# Patient Record
Sex: Female | Born: 1975 | Race: White | Hispanic: No | Marital: Married | Smoking: Never smoker
Health system: Southern US, Community
[De-identification: ages and names within clinical notes are randomized; demographics above are authoritative.]

## PROBLEM LIST (undated history)

## (undated) DIAGNOSIS — J302 Other seasonal allergic rhinitis: Secondary | ICD-10-CM

## (undated) DIAGNOSIS — M722 Plantar fascial fibromatosis: Secondary | ICD-10-CM

## (undated) HISTORY — PX: PELVIC LAPAROSCOPY: SHX162

## (undated) HISTORY — DX: Plantar fascial fibromatosis: M72.2

## (undated) HISTORY — PX: TUBAL LIGATION: SHX77

## (undated) HISTORY — PX: BASAL CELL CARCINOMA EXCISION: SHX1214

## (undated) HISTORY — DX: Other seasonal allergic rhinitis: J30.2

---

## 2010-05-31 HISTORY — PX: COLONOSCOPY: SHX174

## 2011-05-27 ENCOUNTER — Encounter: Payer: Self-pay | Admitting: Women's Health

## 2011-05-27 ENCOUNTER — Ambulatory Visit (INDEPENDENT_AMBULATORY_CARE_PROVIDER_SITE_OTHER): Payer: BC Managed Care – PPO | Admitting: Women's Health

## 2011-05-27 ENCOUNTER — Other Ambulatory Visit (HOSPITAL_COMMUNITY)
Admission: RE | Admit: 2011-05-27 | Discharge: 2011-05-27 | Disposition: A | Discharge status: Home / Self Care | Payer: BC Managed Care – PPO | Source: Ambulatory Visit | Attending: Obstetrics and Gynecology | Admitting: Obstetrics and Gynecology

## 2011-05-27 VITALS — BP 110/72 | Ht 62.0 in | Wt 177.0 lb

## 2011-05-27 DIAGNOSIS — Z01419 Encounter for gynecological examination (general) (routine) without abnormal findings: Secondary | ICD-10-CM | POA: Insufficient documentation

## 2011-05-27 DIAGNOSIS — B373 Candidiasis of vulva and vagina: Secondary | ICD-10-CM

## 2011-05-27 DIAGNOSIS — N920 Excessive and frequent menstruation with regular cycle: Secondary | ICD-10-CM

## 2011-05-27 DIAGNOSIS — R823 Hemoglobinuria: Secondary | ICD-10-CM

## 2011-05-27 DIAGNOSIS — Z833 Family history of diabetes mellitus: Secondary | ICD-10-CM

## 2011-05-27 MED ORDER — FLUCONAZOLE 150 MG PO TABS: 150.0000 mg | ORAL_TABLET | Freq: Once | ORAL | Status: AC

## 2011-05-27 NOTE — Progress Notes (Signed)
Donna Pitts 12/22/1975 [id number]    History:    The patient presents for annual exam.  6 day heavy monthly cycle with cramps/BTL. Increased PMS type symptoms monthly. Numerous food allergies causing rectal rash/itching.   Past medical history, past surgical history, family history and social history were all reviewed and documented in the EPIC chart. Moved here from [place].   ROS:  A  ROS was performed and pertinent positives and negatives are included in the history.  Exam:  Filed Vitals:   05/27/11 0806  BP: 110/72    General appearance:  Normal Head/Neck:  Normal, without cervical or supraclavicular adenopathy. Thyroid:  Symmetrical, normal in size, without palpable masses or nodularity. Respiratory  Effort:  Normal  Auscultation:  Clear without wheezing or rhonchi Cardiovascular  Auscultation:  Regular rate, without rubs, murmurs or gallops  Edema/varicosities:  Not grossly evident Abdominal  Soft,nontender, without masses, guarding or rebound.  Liver/spleen:  No organomegaly noted  Hernia:  None appreciated  Skin  Inspection:  Grossly normal  Palpation:  Grossly normal Neurologic/psychiatric  Orientation:  Normal with appropriate conversation.  Mood/affect:  Normal  Genitourinary    Breasts: Examined lying and sitting.     Right: Without masses, retractions, discharge or axillary adenopathy.     Left: Without masses, retractions, discharge or axillary adenopathy.   Inguinal/mons:  Normal without inguinal adenopathy  External genitalia:  Normal  BUS/Urethra/Skene's glands:  Normal  Bladder:  Normal  Vagina:  Normal  Cervix:  Normal  Uterus:   normal in size, shape and contour.  Midline and mobile  Adnexa/parametria:     Rt: Without masses or tenderness.   Lt: Without masses or tenderness.  Anus and perineum: Erythematous   Digital rectal exam: Normal sphincter tone without palpated masses or tenderness  Assessment/Plan:  35 y.o. MWF G2 P2 for annual  exam.   Menorrhagia/dysmenorrhea Yeast Numerous food allergies  Plan: CBC, TSH, prolactin, glucose, UA and Pap. Reviewed if labs were normal to proceed with a sonohysterogram due to the changes in her menstrual cycle. Her option reviewed, handout given and reviewed. SBEs, exercise, calcium rich diet, encouraged. Rectal rash will continue treatment her dermatologist and allergist. Diflucan 150 by mouth x1 dose with refill.    Harrington Challenger [facility], 12:17 PM 05/27/2011

## 2011-06-15 ENCOUNTER — Ambulatory Visit (INDEPENDENT_AMBULATORY_CARE_PROVIDER_SITE_OTHER): Payer: BC Managed Care – PPO | Admitting: Gynecology

## 2011-06-15 ENCOUNTER — Ambulatory Visit (INDEPENDENT_AMBULATORY_CARE_PROVIDER_SITE_OTHER): Payer: BC Managed Care – PPO

## 2011-06-15 ENCOUNTER — Encounter: Payer: Self-pay | Admitting: Gynecology

## 2011-06-15 ENCOUNTER — Other Ambulatory Visit: Payer: Self-pay | Admitting: Gynecology

## 2011-06-15 DIAGNOSIS — N831 Corpus luteum cyst of ovary, unspecified side: Secondary | ICD-10-CM

## 2011-06-15 DIAGNOSIS — N920 Excessive and frequent menstruation with regular cycle: Secondary | ICD-10-CM

## 2011-06-15 NOTE — Patient Instructions (Signed)
Follow up for biopsy results. We will discuss treatment options.

## 2011-06-15 NOTE — Progress Notes (Signed)
Patient presents for sonohysterogram the tube menorrhagia. Patient's menses have progressively worsened to where now she has heavy bleeding episodes double protection bleed through episodes. Hormonal studies to include TSH and prolactin were normal and she has a hemoglobin of 12. She is status post BTL has no further desire for childbearing.  Ultrasound shows endometrial echo 8.6 mm. Normal myometrial echo texture.  Right and left ovaries with physiologic changes. Cul-de-sac negative. Sonohysterogram performed, sterile technique, easy catheter introduction, good distention with no abnormalities. Endometrial sample was taken. Patient tolerated well.  Assessment and plan: Menorrhagia, BTL, normal hormone studies and normal sonohysterogram. Patient will follow up for biopsy results. I reviewed options for her menorrhagia to include observation, BCP suppression, Mirena IUD, endometrial ablation, hysterectomy. I gave her literature on HerOption endometrial ablation. She's interested in considering this and will follow up with her decision.

## 2011-06-16 ENCOUNTER — Other Ambulatory Visit: Payer: Self-pay

## 2011-06-16 ENCOUNTER — Telehealth: Payer: Self-pay

## 2011-06-16 DIAGNOSIS — N920 Excessive and frequent menstruation with regular cycle: Secondary | ICD-10-CM

## 2011-06-16 MED ORDER — PROGESTERONE MICRONIZED 200 MG PO CAPS: ORAL_CAPSULE | ORAL | Status: DC

## 2011-06-16 MED ORDER — MISOPROSTOL 200 MCG PO TABS: ORAL_TABLET | ORAL | Status: DC

## 2011-06-16 NOTE — Telephone Encounter (Signed)
Left message for patient regarding Dr. Velvet Bathe reply. I did tell her that no time available on schedules until Friday at 9am.  She would need to postpone her trip until Sunday or Monday and would need a preop consult before procedure.  Left message for her to call me and let me know what she thinks she can do.

## 2011-06-16 NOTE — Telephone Encounter (Signed)
I called patient to let her know I checked her ins benefits for Her Option Ablation in the office.  Her deductible and co-insurance apply. She has met her $4000 deductible this year and insurance is paying 100%.  However, her benefit plan year ends 07/01/11 and after that it will cost her $4000 to have procedure.    Patient would like to schedule ASAP. I explained it is scheduled with her menstrual cycle. She said that the [facility] yesterday appears to have brought her period on.  Her LMP before this bleeding was 05/19/11.  She expects if this is a normal period that she will be finished by Monday.  06/21/11.  The next issue is she has a business trip to [place] and was to leave next Friday 06/25/11.  However, she could leave Sat, Sun. Or Mon 1/28.  I told her I was not sure Dr. Velvet Bathe would want to do this procedure and have her fly to [place] and be so far away so soon after procedure.  I told her I would check with Dr. Velvet Bathe for his recommendation.    (I do not have time available on the u/s schedule until Friday 06/25/11.  I would have to reschedule several patients on another day to make it work on an earlier day next week. That can be done if Dr. Velvet Bathe requests [place] to.)  Dr. Orlene Plum.

## 2011-06-16 NOTE — Telephone Encounter (Signed)
If we can arrange this it is okay by me. I do not have a problem if she flys 2 days later. I would not fly out the very next day just in case she has more cramping which can sometimes happen.

## 2011-06-16 NOTE — Telephone Encounter (Signed)
Patient called me back.  She postponed her trip until Monday, Jan 28.  We scheduled her ablation procedure for Friday, Jan 25 at [facility] and she will come for preop consult with Dr. Velvet Bathe the morning before at 9:30am.  I instructed her regarding starting the Prometrium tomorrow night (Day 3) hs and taking q hs until procedure.  I escribed this to her pharmacy.  She was also advised regarding need for Cytotec tab vaginally hs before surgery and that was escribed as well. I made her aware that she would need someone to drive her to and from the office the day of surgery and her mom will do that.  She was instructed regarding a full bladder for the procedure.  I have mailed her a sheet with full bladder instructions and all her dates and times are included.

## 2011-06-24 ENCOUNTER — Ambulatory Visit (INDEPENDENT_AMBULATORY_CARE_PROVIDER_SITE_OTHER): Payer: BC Managed Care – PPO | Admitting: Gynecology

## 2011-06-24 ENCOUNTER — Other Ambulatory Visit: Payer: Self-pay

## 2011-06-24 ENCOUNTER — Encounter: Payer: Self-pay | Admitting: Gynecology

## 2011-06-24 DIAGNOSIS — R21 Rash and other nonspecific skin eruption: Secondary | ICD-10-CM

## 2011-06-24 DIAGNOSIS — N920 Excessive and frequent menstruation with regular cycle: Secondary | ICD-10-CM

## 2011-06-24 MED ORDER — DIAZEPAM 5 MG PO TABS: 5.0000 mg | ORAL_TABLET | Freq: Four times a day (QID) | ORAL | Status: AC | PRN

## 2011-06-24 MED ORDER — AZITHROMYCIN 500 MG PO TABS: 500.0000 mg | ORAL_TABLET | Freq: Every day | ORAL | Status: AC

## 2011-06-24 MED ORDER — FLUCONAZOLE 200 MG PO TABS: 200.0000 mg | ORAL_TABLET | Freq: Every day | ORAL | Status: AC

## 2011-06-24 MED ORDER — NYSTATIN-TRIAMCINOLONE 100000-0.1 UNIT/GM-% EX OINT: TOPICAL_OINTMENT | Freq: Two times a day (BID) | CUTANEOUS | Status: AC

## 2011-06-24 MED ORDER — HYDROCODONE-ACETAMINOPHEN 5-500 MG PO TABS: 2.0000 | ORAL_TABLET | Freq: Four times a day (QID) | ORAL | Status: AC | PRN

## 2011-06-24 NOTE — Progress Notes (Signed)
Patient presents preop for upcoming HerOption endometrial ablation. She's also complaining of a perianal rash that's been chronic recurrent.  Exam with Amy chaperone present Pelvic external with intense perianal rash with skin cracking. BUS vagina normal. Cervix normal. Uterus normal size midline mobile nontender. Adnexa without masses.  Assessment and plan: 1. Perianal rash consistent with yeast. Treat with Diflucan 200 daily x7 days. Mytrex cream twice a day. Follow up if persists or recurs. 2. I reviewed HerOption endometrial ablation. She is having unacceptable menorrhagia, status post BTL with negative sonohysterogram and negative endometrial biopsy. We discussed options for treatment previously and she elected for endometrial ablation. She understands is a permanent procedure and that she should never pursue pregnancy despite having a tubal sterilization after the procedure. She also understands or is no guarantees that this will a limited her menorrhagia. She may bleed as heavy or heavier following the procedure she understands and accepts this. She also understands that she will more likely continue to have menses following the procedure but hopefully lighter. The procedure itself was reviewed with her and the risks to include infection, hemorrhage necessitating transfusion, damage to internal organs either through perforation or transuterine cryo- damage to bladder and intestines requiring major exploratory reparative surgeries. The risks of hematometria are also discussed. I reviewed her medications and how to use them and the postoperative precautions. She has read through the consent form and signed the consent her questions were answered.

## 2011-06-24 NOTE — Patient Instructions (Signed)
Follow up for HerOption endometrial ablation tomorrow Take Diflucan one pill daily for 7 days and apply cream perianally twice daily

## 2011-06-25 ENCOUNTER — Ambulatory Visit (INDEPENDENT_AMBULATORY_CARE_PROVIDER_SITE_OTHER): Payer: BC Managed Care – PPO | Admitting: Gynecology

## 2011-06-25 ENCOUNTER — Ambulatory Visit: Payer: BC Managed Care – PPO

## 2011-06-25 ENCOUNTER — Encounter: Payer: Self-pay | Admitting: Gynecology

## 2011-06-25 VITALS — BP 124/80 | HR 88

## 2011-06-25 VITALS — BP 122/78 | HR 74

## 2011-06-25 DIAGNOSIS — N949 Unspecified condition associated with female genital organs and menstrual cycle: Secondary | ICD-10-CM

## 2011-06-25 DIAGNOSIS — N920 Excessive and frequent menstruation with regular cycle: Secondary | ICD-10-CM

## 2011-06-25 HISTORY — PX: ENDOMETRIAL CRYOABLATION: SHX1499

## 2011-06-25 MED ORDER — KETOROLAC TROMETHAMINE 30 MG/ML IJ SOLN: 60.0000 mg | Freq: Once | INTRAMUSCULAR | Status: AC

## 2011-06-25 MED ORDER — IBUPROFEN 800 MG PO TABS: 800.0000 mg | ORAL_TABLET | Freq: Three times a day (TID) | ORAL | Status: AC | PRN

## 2011-06-25 MED ORDER — LIDOCAINE HCL (PF) 1 % IJ SOLN: 12.0000 mL | Freq: Once | INTRAMUSCULAR | Status: DC

## 2011-06-25 NOTE — Patient Instructions (Signed)
Follow postoperative instruction sheet as provided. Follow up appointment February 8 at 9:30 AM

## 2011-06-25 NOTE — Progress Notes (Signed)
HER OPTION ENDOMETRIAL ABLATION PROCEDURAL NOTE    Donna Pitts 19-Apr-1976 [id number]   06/25/2011  Diagnosis:  Excessive uterine bleeding / Menorrhagia  Procedure:  Endometrial cryoablation with intraoperative ultrasonic guidance  Procedure:  The patient was brought to the treatment room having previously been counseled for the procedure and having signed the consent form that is scanned into Epic. Pre procedural medications received were Toradol 30 mg IM, Valium 5 mg by mouth.  The patient was placed in the dorsal lithotomy position and a speculum was inserted. The cervix and upper vagina were cleansed with Betadine. A single-tooth tenaculum was placed on the anterior lip of the cervix. A  paracervical block was placed yes using 8 cc's of 1% lidocaine.  The uterus was yes sounded 9 centimeters. Cervical dilatation performed no . Under ultrasound guidance, the Her Option probe was introduced into the uterine cavity after the preprocedural sequence was performed. After assuring proper cornual placement, cryoablation was then performed under continuous ultrasound guidance monitoring the growth of the cryo-zone. Sequential cryoablation's were performed in the following order:   Location   Length of Time Myometrial Depth 1. right cornua   6 minutes  14 mm 2. left cornua   5 minutes  12 mm   The patient did receive 1 Vicodin 5-500 between the 2 freeze cycles  .Upon completion of the procedure the instruments were removed, hemostasis visualized the patient was assisted to the bathroom and then to another exam room where she was observed. The patient tolerated the procedure well and was released in stable condition with her driver along with a copy of the postprocedural instructions and precautions which were reviewed with her. She is to return to the office in 2 weeks for post procedural check and received an appointment July 09 2011 at 9:30 AM.    Dara Lords MD, 10:21 AM  06/25/2011

## 2011-07-09 ENCOUNTER — Ambulatory Visit (INDEPENDENT_AMBULATORY_CARE_PROVIDER_SITE_OTHER): Payer: BC Managed Care – PPO | Admitting: Gynecology

## 2011-07-09 ENCOUNTER — Encounter: Payer: Self-pay | Admitting: Gynecology

## 2011-07-09 VITALS — BP 112/66

## 2011-07-09 DIAGNOSIS — L292 Pruritus vulvae: Secondary | ICD-10-CM

## 2011-07-09 DIAGNOSIS — L293 Anogenital pruritus, unspecified: Secondary | ICD-10-CM

## 2011-07-09 DIAGNOSIS — Z9889 Other specified postprocedural states: Secondary | ICD-10-CM

## 2011-07-09 MED ORDER — FLUCONAZOLE 200 MG PO TABS: 200.0000 mg | ORAL_TABLET | Freq: Every day | ORAL | Status: AC

## 2011-07-09 NOTE — Progress Notes (Signed)
Patient presents 2 weeks postoperative from HerOption endometrial ablation. She is doing well having a watery discharge. She notes some perineal irritation. She was treated for a yeast vulvovaginitis with Diflucan 200 mg x7 days which work but now seems to be returning.  Exam with Selena Batten chaperone present Pelvic external BUS vagina normal with slight watery discharge. Cervix normal. Uterus normal size midline mobile nontender. Adnexa without masses or tenderness.  Assessment and plan: Status post HerOption endometrial ablation doing well.  I refilled her Diflucan 200 mg #10 to be taken several daily now to eradicate early symptoms and have the remainder available if she has recurrences.  Otherwise she will keep a menstrual calendar and assuming she has a good response with light menses will see [place] at the end of this coming year for her annual exam.

## 2011-07-09 NOTE — Patient Instructions (Signed)
Used Diflucan as needed. Keep menstrual calendar as long as acceptable then follow up end of this year for your annual exam.

## 2011-09-08 MED ORDER — KETOROLAC TROMETHAMINE 30 MG/ML IJ SOLN: 60.0000 mg | Freq: Once | INTRAMUSCULAR | Status: DC

## 2013-02-23 ENCOUNTER — Encounter: Payer: Self-pay | Admitting: Internal Medicine

## 2013-02-23 ENCOUNTER — Ambulatory Visit (INDEPENDENT_AMBULATORY_CARE_PROVIDER_SITE_OTHER): Payer: Managed Care, Other (non HMO) | Admitting: Internal Medicine

## 2013-02-23 VITALS — BP 130/70 | HR 76 | Temp 98.1°F | Ht 62.25 in | Wt 180.0 lb

## 2013-02-23 DIAGNOSIS — Z803 Family history of malignant neoplasm of breast: Secondary | ICD-10-CM | POA: Insufficient documentation

## 2013-02-23 DIAGNOSIS — Z23 Encounter for immunization: Secondary | ICD-10-CM

## 2013-02-23 DIAGNOSIS — Z0000 Encounter for general adult medical examination without abnormal findings: Secondary | ICD-10-CM

## 2013-02-23 NOTE — Assessment & Plan Note (Signed)
General medical exam normal today. Breast and pelvic exam deferred as completed by her OB/GYN. Encouraged healthy diet and regular exercise. Tdap and Flu vaccine given today. Will check labs including CBC, CMP, lipids. Will set up baseline mammogram given family h/o breast cancer. Follow up 1 year and prn.

## 2013-02-23 NOTE — Progress Notes (Signed)
Subjective:    Patient ID: Donna Pitts, female    DOB: 02/02/1976, 37 y.o.   MRN: [id number]  HPI 37YO female presents to establish care. Generally has been healthy. Had some issues with menorrhagia and ultimately underwent endometrial ablation. Now has very light menses lasting 2-3 days each month. S/p Tubal ligation.   Also recently had issues with plantar fasciitis. Symptoms improved with meloxicam, but unable to tolerate because of dizziness and stomach upset. Followed by podiatry.  Tries to follow a healthy diet and get regular exercise. Somewhat limited because of frequent travel with work.   Outpatient Encounter Prescriptions as of 02/23/2013  Medication Sig Dispense Refill  . cetirizine (ZYRTEC) 10 MG tablet Take 10 mg by mouth daily.         No facility-administered encounter medications on file as of 02/23/2013.   BP 130/70  Pulse 76  Temp(Src) 98.1 F (36.7 C) (Oral)  Ht 5' 2.25" (1.581 m)  Wt 180 lb (81.647 kg)  BMI 32.66 kg/m2  SpO2 97%  LMP 02/06/2013  Review of Systems  Constitutional: Negative for fever, chills, appetite change, fatigue and unexpected weight change.  HENT: Negative for ear pain, congestion, sore throat, trouble swallowing, neck pain, voice change and sinus pressure.   Eyes: Negative for visual disturbance.  Respiratory: Negative for cough, shortness of breath, wheezing and stridor.   Cardiovascular: Negative for chest pain, palpitations and leg swelling.  Gastrointestinal: Negative for nausea, vomiting, abdominal pain, diarrhea, constipation, blood in stool, abdominal distention and anal bleeding.  Genitourinary: Negative for dysuria and flank pain.  Musculoskeletal: Negative for myalgias, arthralgias and gait problem.  Skin: Negative for color change and rash.  Neurological: Negative for dizziness and headaches.  Hematological: Negative for adenopathy. Does not bruise/bleed easily.  Psychiatric/Behavioral: Negative for suicidal ideas, sleep  disturbance and dysphoric mood. The patient is not nervous/anxious.        Objective:   Physical Exam  Constitutional: She is oriented to person, place, and time. She appears well-developed and well-nourished. No distress.  HENT:  Head: Normocephalic and atraumatic.  Right Ear: External ear normal.  Left Ear: External ear normal.  Nose: Nose normal.  Mouth/Throat: Oropharynx is clear and moist. No oropharyngeal exudate.  Eyes: Conjunctivae are normal. Pupils are equal, round, and reactive to light. Right eye exhibits no discharge. Left eye exhibits no discharge. No scleral icterus.  Neck: Normal range of motion. Neck supple. No tracheal deviation present. No thyromegaly present.  Cardiovascular: Normal rate, regular rhythm, normal heart sounds and intact distal pulses.  Exam reveals no gallop and no friction rub.   No murmur heard. Pulmonary/Chest: Effort normal and breath sounds normal. No accessory muscle usage. Not tachypneic. No respiratory distress. She has no decreased breath sounds. She has no wheezes. She has no rhonchi. She has no rales. She exhibits no tenderness.  Abdominal: Soft. Bowel sounds are normal. She exhibits no distension and no mass. There is no tenderness. There is no rebound and no guarding.  Musculoskeletal: Normal range of motion. She exhibits no edema and no tenderness.  Lymphadenopathy:    She has no cervical adenopathy.  Neurological: She is alert and oriented to person, place, and time. No cranial nerve deficit. She exhibits normal muscle tone. Coordination normal.  Skin: Skin is warm and dry. No rash noted. She is not diaphoretic. No erythema. No pallor.  Psychiatric: She has a normal mood and affect. Her behavior is normal. Judgment and thought content normal.  Assessment & Plan:

## 2013-03-01 ENCOUNTER — Other Ambulatory Visit (INDEPENDENT_AMBULATORY_CARE_PROVIDER_SITE_OTHER): Payer: Managed Care, Other (non HMO)

## 2013-03-01 DIAGNOSIS — Z0000 Encounter for general adult medical examination without abnormal findings: Secondary | ICD-10-CM

## 2013-03-01 LAB — CBC WITH DIFFERENTIAL/PLATELET
Basophils Relative: 0.7 % (ref 0.0–3.0)
Eosinophils Absolute: 0.3 10*3/uL (ref 0.0–0.7)
Eosinophils Relative: 5.1 % — ABNORMAL HIGH (ref 0.0–5.0)
Lymphocytes Relative: 33 % (ref 12.0–46.0)
MCHC: 34.2 g/dL (ref 30.0–36.0)
Monocytes Relative: 5.6 % (ref 3.0–12.0)
Neutro Abs: 3.3 10*3/uL (ref 1.4–7.7)
Neutrophils Relative %: 55.6 % (ref 43.0–77.0)
Platelets: 293 10*3/uL (ref 150.0–400.0)
RBC: 3.93 Mil/uL (ref 3.87–5.11)
WBC: 5.9 10*3/uL (ref 4.5–10.5)

## 2013-03-02 LAB — COMPREHENSIVE METABOLIC PANEL
Albumin: 4.1 g/dL (ref 3.5–5.2)
BUN: 8 mg/dL (ref 6–23)
CO2: 25 mEq/L (ref 19–32)
Calcium: 8.6 mg/dL (ref 8.4–10.5)
Chloride: 107 mEq/L (ref 96–112)
GFR: 106.38 mL/min (ref >60.00)
Glucose, Bld: 75 mg/dL (ref 70–99)
Potassium: 4.2 mEq/L (ref 3.5–5.1)
Total Bilirubin: 0.8 mg/dL (ref 0.3–1.2)

## 2013-03-02 LAB — LIPID PANEL
Cholesterol: 190 mg/dL (ref 0–200)
HDL: 45.1 mg/dL (ref >39.00)
LDL Cholesterol: 130 mg/dL — ABNORMAL HIGH (ref 0–99)
Total CHOL/HDL Ratio: 4
Triglycerides: 73 mg/dL (ref 0.0–149.0)
VLDL: 14.6 mg/dL (ref 0.0–40.0)

## 2013-03-05 ENCOUNTER — Encounter: Payer: Self-pay | Admitting: *Deleted

## 2013-05-23 ENCOUNTER — Ambulatory Visit (INDEPENDENT_AMBULATORY_CARE_PROVIDER_SITE_OTHER): Payer: Managed Care, Other (non HMO) | Admitting: Internal Medicine

## 2013-05-23 ENCOUNTER — Encounter: Payer: Self-pay | Admitting: Internal Medicine

## 2013-05-23 VITALS — BP 120/80 | HR 104 | Temp 100.0°F

## 2013-05-23 DIAGNOSIS — R112 Nausea with vomiting, unspecified: Secondary | ICD-10-CM

## 2013-05-23 DIAGNOSIS — R6889 Other general symptoms and signs: Secondary | ICD-10-CM

## 2013-05-23 LAB — POCT INFLUENZA A/B
Influenza A, POC: NEGATIVE
Influenza B, POC: NEGATIVE

## 2013-05-23 MED ORDER — ONDANSETRON HCL 8 MG PO TABS: 8.0000 mg | ORAL_TABLET | Freq: Three times a day (TID) | ORAL | Status: DC | PRN

## 2013-05-23 MED ORDER — OSELTAMIVIR PHOSPHATE 75 MG PO CAPS: 75.0000 mg | ORAL_CAPSULE | Freq: Two times a day (BID) | ORAL | Status: DC

## 2013-05-23 MED ORDER — HYDROCOD POLST-CHLORPHEN POLST 10-8 MG/5ML PO LQCR: 5.0000 mL | Freq: Two times a day (BID) | ORAL | Status: DC | PRN

## 2013-05-23 MED ORDER — PROMETHAZINE HCL 25 MG/ML IJ SOLN
25.0000 mg | Freq: Once | INTRAMUSCULAR | Status: AC
  Administered 2013-05-23: 25 mg via INTRAVENOUS

## 2013-05-23 MED ORDER — IBUPROFEN 800 MG PO TABS: 800.0000 mg | ORAL_TABLET | Freq: Three times a day (TID) | ORAL | Status: DC | PRN

## 2013-05-23 NOTE — Progress Notes (Signed)
   Subjective:    Patient ID: Donna Pitts, female    DOB: [birth year], 37 y.o.   MRN: [id number]  HPI 37YO female presents for acute visit. Started fever, chills, dry cough, headache, malaise, myalgias, fatigue last night. No dyspnea or chest pain.  Nauseous, no vomiting. Watery diarrhea last night. Daughter pos for Flu A. Had flu shot this year.    Outpatient Encounter Prescriptions as of 05/23/2013  Medication Sig  . cetirizine (ZYRTEC) 10 MG tablet Take 10 mg by mouth daily.        Review of Systems  Constitutional: Positive for fever, chills and fatigue. Negative for unexpected weight change.  HENT: Positive for congestion, postnasal drip, rhinorrhea, sinus pressure and sore throat. Negative for ear discharge, ear pain, facial swelling, hearing loss, mouth sores, nosebleeds, sneezing, tinnitus, trouble swallowing and voice change.   Eyes: Negative for pain, discharge, redness and visual disturbance.  Respiratory: Positive for cough and shortness of breath. Negative for chest tightness, wheezing and stridor.   Cardiovascular: Negative for chest pain, palpitations and leg swelling.  Gastrointestinal: Positive for nausea and diarrhea. Negative for vomiting and abdominal pain.  Musculoskeletal: Positive for myalgias. Negative for arthralgias, neck pain and neck stiffness.  Skin: Negative for color change and rash.  Neurological: Negative for dizziness, weakness, light-headedness and headaches.  Hematological: Negative for adenopathy.       Objective:   Physical Exam  Constitutional: She is oriented to person, place, and time. She appears well-developed and well-nourished. She appears ill. No distress.  HENT:  Head: Normocephalic and atraumatic.  Right Ear: External ear normal.  Left Ear: External ear normal.  Nose: Mucosal edema and rhinorrhea present.  Mouth/Throat: Posterior oropharyngeal erythema present. No oropharyngeal exudate.  Eyes: Conjunctivae are normal. Pupils  are equal, round, and reactive to light. Right eye exhibits no discharge. Left eye exhibits no discharge. No scleral icterus.  Neck: Normal range of motion. Neck supple. No tracheal deviation present. No thyromegaly present.  Cardiovascular: Normal rate, regular rhythm, normal heart sounds and intact distal pulses.  Exam reveals no gallop and no friction rub.   No murmur heard. Pulmonary/Chest: Effort normal. No accessory muscle usage. Not tachypneic. No respiratory distress. She has no decreased breath sounds. She has no wheezes. She has rhonchi (scattered). She has no rales. She exhibits no tenderness.  Musculoskeletal: Normal range of motion. She exhibits no edema and no tenderness.  Lymphadenopathy:    She has no cervical adenopathy.  Neurological: She is alert and oriented to person, place, and time. No cranial nerve deficit. She exhibits normal muscle tone. Coordination normal.  Skin: Skin is warm and dry. No rash noted. She is not diaphoretic. No erythema. No pallor.  Psychiatric: She has a normal mood and affect. Her behavior is normal. Judgment and thought content normal.          Assessment & Plan:

## 2013-05-23 NOTE — Assessment & Plan Note (Signed)
Symptoms and exam most consistent with influenza. Pt daughter pos for Flu A. Rapid test here in office negative, however will treat as flu. Tamiflu 75mg  bid x 5 days. Tussionex as needed for cough. Ibuprofen prn pain or fever. Ondansetron at home as needed for nausea. Phenergan given IM today. Encouraged rest, adequate fluid intake. Follow up if symptoms not improving over next 24-48hr.

## 2013-05-23 NOTE — Patient Instructions (Signed)

## 2013-10-16 ENCOUNTER — Other Ambulatory Visit: Payer: Self-pay | Admitting: Internal Medicine

## 2013-10-16 MED ORDER — ONDANSETRON 8 MG PO TBDP: 8.0000 mg | ORAL_TABLET | Freq: Three times a day (TID) | ORAL | Status: DC | PRN

## 2013-10-16 MED ORDER — CIPROFLOXACIN HCL 500 MG PO TABS: 500.0000 mg | ORAL_TABLET | Freq: Two times a day (BID) | ORAL | Status: DC

## 2013-10-16 MED ORDER — SCOPOLAMINE 1 MG/3DAYS TD PT72
1.0000 | MEDICATED_PATCH | TRANSDERMAL | Status: DC
Start: 2013-10-16 — End: 2015-05-20

## 2014-03-16 ENCOUNTER — Ambulatory Visit (INDEPENDENT_AMBULATORY_CARE_PROVIDER_SITE_OTHER): Payer: BC Managed Care – PPO

## 2014-03-16 DIAGNOSIS — Z23 Encounter for immunization: Secondary | ICD-10-CM

## 2014-04-01 ENCOUNTER — Encounter: Payer: Self-pay | Admitting: Internal Medicine

## 2015-05-20 ENCOUNTER — Encounter: Payer: Self-pay | Admitting: Internal Medicine

## 2015-05-20 ENCOUNTER — Ambulatory Visit (INDEPENDENT_AMBULATORY_CARE_PROVIDER_SITE_OTHER): Payer: BLUE CROSS/BLUE SHIELD | Admitting: Internal Medicine

## 2015-05-20 VITALS — BP 124/86 | HR 107 | Temp 100.5°F | Wt 191.2 lb

## 2015-05-20 DIAGNOSIS — J111 Influenza due to unidentified influenza virus with other respiratory manifestations: Secondary | ICD-10-CM | POA: Insufficient documentation

## 2015-05-20 LAB — POCT INFLUENZA A/B
INFLUENZA A, POC: POSITIVE — AB
INFLUENZA B, POC: POSITIVE — AB

## 2015-05-20 MED ORDER — ONDANSETRON HCL 8 MG PO TABS: 8.0000 mg | ORAL_TABLET | Freq: Three times a day (TID) | ORAL | Status: DC | PRN

## 2015-05-20 MED ORDER — OSELTAMIVIR PHOSPHATE 75 MG PO CAPS: 75.0000 mg | ORAL_CAPSULE | Freq: Two times a day (BID) | ORAL | Status: DC

## 2015-05-20 MED ORDER — HYDROCOD POLST-CPM POLST ER 10-8 MG/5ML PO SUER: 5.0000 mL | Freq: Two times a day (BID) | ORAL | Status: DC | PRN

## 2015-05-20 NOTE — Addendum Note (Signed)
Addended by: Ronna PolioWALKER, Eugenie Harewood A on: 05/20/2015 02:37 PM   Modules accepted: Orders

## 2015-05-20 NOTE — Progress Notes (Signed)
Subjective:    Patient ID: Donna GhentChristina Pitts, female    DOB: Apr 22, 1976, 39 y.o.   MRN: [id number]  HPI  39YO female presents for acute visit.  Last two nights has had chills and fever. This morning feels achy with congestion and cough. Cough is non-productive. No dyspnea. Not taking anything for this.  Had Influenza vaccine in Oct 2016.  Wt Readings from Last 3 Encounters:  05/20/15 191 lb 4 oz (86.75 kg)  02/23/13 180 lb (81.647 kg)  05/27/11 177 lb (80.287 kg)   BP Readings from Last 3 Encounters:  05/20/15 124/86  05/23/13 120/80  02/23/13 130/70    Past Medical History  Diagnosis Date  . Dysmenorrhea   . Seasonal allergies   . Plantar fasciitis     Dr. Orland Jarredroxler   Family History  Problem Relation Age of Onset  . Hypertension Mother   . Thyroid disease Mother   . Diabetes Father   . Hypertension Father   . Diabetes Paternal Grandmother   . Hypertension Paternal Grandmother   . Breast cancer Paternal Grandmother   . Cancer Paternal Grandmother [id number]    breast  . Diabetes Paternal Grandfather   . Hypertension Paternal Grandfather   . Heart disease Paternal Grandfather [id number]  . Allergies Son   . Heart disease Maternal Grandmother    Past Surgical History  Procedure Laterality Date  . Colonoscopy  2012  . Pelvic laparoscopy    . Tubal ligation    . Endometrial cryoablation  06/25/2011    HEROPTION  . Cesarean section      X2, pre-ecclampsia, HEELP   Social History   Social History  . Marital Status: Married    Spouse Name: N/A  . Number of Children: N/A  . Years of Education: N/A   Social History Main Topics  . Smoking status: Never Smoker   . Smokeless tobacco: Never Used  . Alcohol Use: Yes     Comment: OCC  . Drug Use: No  . Sexual Activity: Yes    Birth Control/ Protection: Surgical     Comment: TUBAL LIGATION   Other Topics Concern  . None   Social History Narrative   [place] in [place]      Work - [employer]      Diet -  regular      Exercise - limited by work and travel    Review of Systems  Constitutional: Positive for fever, chills and fatigue. Negative for unexpected weight change.  HENT: Positive for congestion, ear pain, postnasal drip and rhinorrhea. Negative for ear discharge, facial swelling, hearing loss, mouth sores, nosebleeds, sinus pressure, sneezing, sore throat, tinnitus, trouble swallowing and voice change.   Eyes: Negative for pain, discharge, redness and visual disturbance.  Respiratory: Positive for cough. Negative for chest tightness, shortness of breath, wheezing and stridor.   Cardiovascular: Negative for chest pain, palpitations and leg swelling.  Musculoskeletal: Positive for myalgias and arthralgias. Negative for neck pain and neck stiffness.  Skin: Negative for color change and rash.  Neurological: Negative for dizziness, weakness, light-headedness and headaches.  Hematological: Negative for adenopathy.       Objective:    BP 124/86 mmHg  Pulse 107  Temp(Src) 100.5 F (38.1 C) (Oral)  Wt 191 lb 4 oz (86.75 kg)  SpO2 98%  LMP 05/16/2015 (Approximate) Physical Exam  Constitutional: She is oriented to person, place, and time. She appears well-developed and well-nourished. No distress.  HENT:  Head: Normocephalic and atraumatic.  Right  Ear: External ear normal.  Left Ear: External ear normal.  Nose: Nose normal.  Mouth/Throat: Oropharynx is clear and moist. No oropharyngeal exudate.  Eyes: Conjunctivae are normal. Pupils are equal, round, and reactive to light. Right eye exhibits no discharge. Left eye exhibits no discharge. No scleral icterus.  Neck: Normal range of motion. Neck supple. No tracheal deviation present. No thyromegaly present.  Cardiovascular: Regular rhythm, normal heart sounds and intact distal pulses.  Tachycardia present.  Exam reveals no gallop and no friction rub.   No murmur heard. Pulmonary/Chest: Effort normal and breath sounds normal. No accessory  muscle usage. No tachypnea. No respiratory distress. She has no decreased breath sounds. She has no wheezes. She has no rhonchi. She has no rales. She exhibits no tenderness.  Musculoskeletal: Normal range of motion. She exhibits no edema or tenderness.  Lymphadenopathy:    She has no cervical adenopathy.  Neurological: She is alert and oriented to person, place, and time. No cranial nerve deficit. She exhibits normal muscle tone. Coordination normal.  Skin: Skin is warm and dry. No rash noted. She is not diaphoretic. No erythema. No pallor.  Psychiatric: She has a normal mood and affect. Her behavior is normal. Judgment and thought content normal.          Assessment & Plan:   Problem List Items Addressed This Visit      Unprioritized   Influenza with respiratory manifestation - Primary    Symptoms c/w Influenza. Rapid test was positive. Will start Tamiflu. Encouraged rest, adequate fluids. Start Sudafed to help with congestion. Tussionex as needed for cough. Call if symptoms are not improving this week.      Relevant Medications   oseltamivir (TAMIFLU) 75 MG capsule   Other Relevant Orders   POCT Influenza A/B (Completed)       Return if symptoms worsen or fail to improve.

## 2015-05-20 NOTE — Patient Instructions (Addendum)
Start Tamiflu twice daily to help shorten course of influenza.  Start Claritin D 12 hr.  Use Tussionex as needed for cough.  Follow up if symptoms are not improving.  Influenza, Adult Influenza ("the flu") is a viral infection of the respiratory tract. It occurs more often in winter months because people spend more time in close contact with one another. Influenza can make you feel very sick. Influenza easily spreads from person to person (contagious). CAUSES  Influenza is caused by a virus that infects the respiratory tract. You can catch the virus by breathing in droplets from an infected person's cough or sneeze. You can also catch the virus by touching something that was recently contaminated with the virus and then touching your mouth, nose, or eyes. RISKS AND COMPLICATIONS You may be at risk for a more severe case of influenza if you smoke cigarettes, have diabetes, have chronic heart disease (such as heart failure) or lung disease (such as asthma), or if you have a weakened immune system. Elderly people and pregnant women are also at risk for more serious infections. The most common problem of influenza is a lung infection (pneumonia). Sometimes, this problem can require emergency medical care and may be life threatening. SIGNS AND SYMPTOMS  Symptoms typically last 4 to 10 days and may include:  Fever.  Chills.  Headache, body aches, and muscle aches.  Sore throat.  Chest discomfort and cough.  Poor appetite.  Weakness or feeling tired.  Dizziness.  Nausea or vomiting. DIAGNOSIS  Diagnosis of influenza is often made based on your history and a physical exam. A nose or throat swab test can be done to confirm the diagnosis. TREATMENT  In mild cases, influenza goes away on its own. Treatment is directed at relieving symptoms. For more severe cases, your health care provider may prescribe antiviral medicines to shorten the sickness. Antibiotic medicines are not effective  because the infection is caused by a virus, not by bacteria. HOME CARE INSTRUCTIONS  Take medicines only as directed by your health care provider.  Use a cool mist humidifier to make breathing easier.  Get plenty of rest until your temperature returns to normal. This usually takes 3 to 4 days.  Drink enough fluid to keep your urine clear or pale yellow.  Cover yourmouth and nosewhen coughing or sneezing,and wash your handswellto prevent thevirusfrom spreading.  Stay homefromwork orschool untilthe fever is gonefor at least 401full day. PREVENTION  An annual influenza vaccination (flu shot) is the best way to avoid getting influenza. An annual flu shot is now routinely recommended for all adults in the [place] SEEK MEDICAL CARE IF:  You experiencechest pain, yourcough worsens,or you producemore mucus.  Youhave nausea,vomiting, ordiarrhea.  Your fever returns or gets worse. SEEK IMMEDIATE MEDICAL CARE IF:  You havetrouble breathing, you become short of breath,or your skin ornails becomebluish.  You have severe painor stiffnessin the neck.  You develop a sudden headache, or pain in the face or ear.  You have nausea or vomiting that you cannot control. MAKE SURE YOU:   Understand these instructions.  Will watch your condition.  Will get help right away if you are not doing well or get worse.   This information is not intended to replace advice given to you by your health care provider. Make sure you discuss any questions you have with your health care provider.   Document Released: 05/14/2000 Document Revised: 06/07/2014 Document Reviewed: 08/16/2011 Elsevier Interactive Patient Education [employer]2016 Elsevier Inc.

## 2015-05-20 NOTE — Progress Notes (Signed)
Pre visit review using our clinic review tool, if applicable. No additional management support is needed unless otherwise documented below in the visit note. 

## 2015-05-20 NOTE — Assessment & Plan Note (Signed)
Symptoms c/w Influenza. Rapid test was positive. Will start Tamiflu. Encouraged rest, adequate fluids. Start Sudafed to help with congestion. Tussionex as needed for cough. Call if symptoms are not improving this week.

## 2015-11-24 ENCOUNTER — Ambulatory Visit: Payer: Self-pay | Admitting: Gynecology

## 2015-12-09 ENCOUNTER — Ambulatory Visit (INDEPENDENT_AMBULATORY_CARE_PROVIDER_SITE_OTHER): Payer: BLUE CROSS/BLUE SHIELD | Admitting: Gynecology

## 2015-12-09 ENCOUNTER — Encounter: Payer: Self-pay | Admitting: Gynecology

## 2015-12-09 VITALS — BP 118/76 | Ht 62.5 in | Wt 188.0 lb

## 2015-12-09 DIAGNOSIS — Z13220 Encounter for screening for lipoid disorders: Secondary | ICD-10-CM

## 2015-12-09 DIAGNOSIS — N920 Excessive and frequent menstruation with regular cycle: Secondary | ICD-10-CM | POA: Diagnosis not present

## 2015-12-09 DIAGNOSIS — Z01419 Encounter for gynecological examination (general) (routine) without abnormal findings: Secondary | ICD-10-CM | POA: Diagnosis not present

## 2015-12-09 NOTE — Addendum Note (Signed)
Addended by: Dayna BarkerGARDNER, Nazaiah Navarrete K on: 12/09/2015 02:13 PM   Modules accepted: Orders, SmartSet

## 2015-12-09 NOTE — Progress Notes (Signed)
    Tonia GhentChristina Crosby 08-31-1975 [id number]        40 y.o.  G2P2  for annual exam. Has not been in the office for over 3 years. Several issues noted below.  Past medical history,surgical history, problem list, medications, allergies, family history and social history were all reviewed and documented as reviewed in the EPIC chart.  ROS:  Performed with pertinent positives and negatives included in the history, assessment and plan.   Additional significant findings :  None   Exam: Kennon PortelaKim Gardner assistant Filed Vitals:   12/09/15 1327  BP: 118/76  Height: 5' 2.5" (1.588 m)  Weight: 188 lb (85.276 kg)   General appearance:  Normal affect, orientation and appearance. Skin: Grossly normal HEENT: Without gross lesions.  No cervical or supraclavicular adenopathy. Thyroid normal.  Lungs:  Clear without wheezing, rales or rhonchi Cardiac: RR, without RMG Abdominal:  Soft, nontender, without masses, guarding, rebound, organomegaly or hernia Breasts:  Examined lying and sitting without masses, retractions, discharge or axillary adenopathy. Pelvic:  Ext/BUS/Vagina Normal.  Cervix normal. Pap smear/HPV  Uterus anteverted, normal size, shape and contour, midline and mobile nontender   Adnexa without masses or tenderness    Anus and perineum normal   Rectovaginal normal sphincter tone without palpated masses or tenderness.    Assessment/Plan:  40 y.o. G2P2 female for annual exam with regular menses, tubal sterilization.   1. Menorrhagia. Patient is status post HerOption endometrial ablation 2013. Went from 7 day flow to a 3-4 day flow. Unfortunately though her menses have remained heavy where she will have bleedthrough episodes double protection for the first day or 2. We'll start with baseline labs to include CBC, TSH and sonohysterogram. Pending sonohysterogram results as far as cavity assessment discussed possibilities to include Mirena IUD, repeat ablation, hormonal manipulation such as  low-dose oral contraceptives up to including hysterectomy. I think she would be a candidate for LAVH given good uterine support and 2 prior cesarean sections. Reviewed all the options with the patient and will further discuss pending the lab and ultrasound results. 2. Pap smear 2012. Pap smear/HPV today. No history of abnormal Pap smears previously. 3. Mammography never. Discussed most recent ACOG screening guidelines. I recommended patient consider mammography now and she's going to arrange this. SBE monthly reviewed. 4. Health maintenance. Patient relates not having routine blood work for over year. Baseline CBC, CMP, lipid profile, urinalysis, TSH ordered. Follow up for sonohysterogram as scheduled   Dara LordsFONTAINE,Evonna Stoltz P MD, 1:58 PM 12/09/2015

## 2015-12-09 NOTE — Patient Instructions (Signed)
Follow up for ultrasound as scheduled 

## 2015-12-10 LAB — URINALYSIS W MICROSCOPIC + REFLEX CULTURE
BILIRUBIN URINE: NEGATIVE
Bacteria, UA: NONE SEEN [HPF]
Casts: NONE SEEN [LPF]
Crystals: NONE SEEN [HPF]
GLUCOSE, UA: NEGATIVE
Hgb urine dipstick: NEGATIVE
Ketones, ur: NEGATIVE
LEUKOCYTES UA: NEGATIVE
NITRITE: NEGATIVE
PROTEIN: NEGATIVE
RBC / HPF: NONE SEEN RBC/HPF (ref <=2)
SPECIFIC GRAVITY, URINE: 1.027 (ref 1.001–1.035)
WBC UA: NONE SEEN WBC/HPF (ref <=5)
YEAST: NONE SEEN [HPF]
pH: 5.5 (ref 5.0–8.0)

## 2015-12-11 ENCOUNTER — Encounter: Payer: Self-pay | Admitting: Gynecology

## 2015-12-11 LAB — PAP IG AND HPV HIGH-RISK: HPV DNA HIGH RISK: NOT DETECTED

## 2015-12-17 ENCOUNTER — Other Ambulatory Visit: Payer: Self-pay | Admitting: Gynecology

## 2015-12-17 DIAGNOSIS — N939 Abnormal uterine and vaginal bleeding, unspecified: Secondary | ICD-10-CM

## 2015-12-23 ENCOUNTER — Telehealth: Payer: Self-pay | Admitting: Gynecology

## 2015-12-23 NOTE — Telephone Encounter (Signed)
12/23/15-Pt was informed today that her [facility] ins will cover the sonohysterogram/bx if needed with her $30 copay. Per Jan @[facility] [phone]

## 2015-12-30 ENCOUNTER — Ambulatory Visit: Payer: Self-pay | Admitting: Gynecology

## 2016-01-02 ENCOUNTER — Encounter: Payer: Self-pay | Admitting: Gynecology

## 2016-01-02 ENCOUNTER — Ambulatory Visit (INDEPENDENT_AMBULATORY_CARE_PROVIDER_SITE_OTHER): Payer: BLUE CROSS/BLUE SHIELD

## 2016-01-02 ENCOUNTER — Ambulatory Visit (INDEPENDENT_AMBULATORY_CARE_PROVIDER_SITE_OTHER): Payer: BLUE CROSS/BLUE SHIELD | Admitting: Gynecology

## 2016-01-02 VITALS — BP 124/80

## 2016-01-02 DIAGNOSIS — Z13220 Encounter for screening for lipoid disorders: Secondary | ICD-10-CM

## 2016-01-02 DIAGNOSIS — N939 Abnormal uterine and vaginal bleeding, unspecified: Secondary | ICD-10-CM

## 2016-01-02 DIAGNOSIS — Z01419 Encounter for gynecological examination (general) (routine) without abnormal findings: Secondary | ICD-10-CM

## 2016-01-02 DIAGNOSIS — N920 Excessive and frequent menstruation with regular cycle: Secondary | ICD-10-CM | POA: Diagnosis not present

## 2016-01-02 LAB — CBC WITH DIFFERENTIAL/PLATELET
BASOS ABS: 0 {cells}/uL (ref 0–200)
Basophils Relative: 0 %
EOS ABS: 110 {cells}/uL (ref 15–500)
EOS PCT: 2 %
HCT: 37 % (ref 35.0–45.0)
HEMOGLOBIN: 12.2 g/dL (ref 11.7–15.5)
LYMPHS ABS: 1815 {cells}/uL (ref 850–3900)
Lymphocytes Relative: 33 %
MCH: 30.8 pg (ref 27.0–33.0)
MCHC: 33 g/dL (ref 32.0–36.0)
MCV: 93.4 fL (ref 80.0–100.0)
MPV: 8.3 fL (ref 7.5–12.5)
Monocytes Absolute: 440 cells/uL (ref 200–950)
Monocytes Relative: 8 %
NEUTROS ABS: 3135 {cells}/uL (ref 1500–7800)
NEUTROS PCT: 57 %
Platelets: 282 10*3/uL (ref 140–400)
RBC: 3.96 MIL/uL (ref 3.80–5.10)
RDW: 13.3 % (ref 11.0–15.0)
WBC: 5.5 10*3/uL (ref 3.8–10.8)

## 2016-01-02 LAB — COMPREHENSIVE METABOLIC PANEL
ALBUMIN: 3.9 g/dL (ref 3.6–5.1)
ALT: 14 U/L (ref 6–29)
AST: 14 U/L (ref 10–30)
Alkaline Phosphatase: 67 U/L (ref 33–115)
BUN: 10 mg/dL (ref 7–25)
CALCIUM: 8.8 mg/dL (ref 8.6–10.2)
CHLORIDE: 107 mmol/L (ref 98–110)
CO2: 26 mmol/L (ref 20–31)
CREATININE: 0.83 mg/dL (ref 0.50–1.10)
Glucose, Bld: 95 mg/dL (ref 65–99)
POTASSIUM: 3.9 mmol/L (ref 3.5–5.3)
SODIUM: 137 mmol/L (ref 135–146)
TOTAL PROTEIN: 6.9 g/dL (ref 6.1–8.1)
Total Bilirubin: 0.4 mg/dL (ref 0.2–1.2)

## 2016-01-02 LAB — TSH: TSH: 1.68 mIU/L

## 2016-01-02 LAB — LIPID PANEL
CHOL/HDL RATIO: 3.9 ratio (ref <=5.0)
CHOLESTEROL: 175 mg/dL (ref 125–200)
HDL: 45 mg/dL — AB (ref >=46)
LDL CALC: 93 mg/dL (ref <130)
TRIGLYCERIDES: 187 mg/dL — AB (ref <150)
VLDL: 37 mg/dL — AB (ref <30)

## 2016-01-02 NOTE — Patient Instructions (Signed)
Office will call you with the biopsy results 

## 2016-01-02 NOTE — Progress Notes (Signed)
    Rokia Quance [birth year] [id number]        40 y.o.  G2P2 presents for sonohysterogram due to menorrhagia. History of prior endometrial ablation. Cycles are shorter but still heavy.  Past medical history,surgical history, problem list, medications, allergies, family history and social history were all reviewed and documented in the EPIC chart.  Directed ROS with pertinent positives and negatives documented in the history of present illness/assessment and plan.  Exam: [place] assistant Vitals:   01/02/16 0934  BP: 124/80   General appearance:  Normal Abdomen soft nontender without masses guarding rebound Pelvic external BUS vagina normal. Cervix normal. Uterus anteverted grossly normal size midline mobile nontender. Adnexa without masses or tenderness.  Ultrasound shows uterus generous in size with homogeneous echo pattern. Endometrial echo 5.1 mm. Right and left ovaries grossly normal. Cul-de-sac negative.  Sonohysterogram performed, sterile technique, easy catheter introduction, good distention with no abnormalities. Endometrial sample taken. Patient tolerated well.  Assessment/Plan:  40 y.o. G2P2 with menorrhagia and normal sonohysterogram. Patient will follow up for biopsy results. I reviewed the options for management to include observation, hormonal manipulation such as low-dose oral contraceptives, Mirena IUD, repeat ablation, hysterectomy. The pros and cons of each choice discussed. We reviewed with involved with hysterectomy. Would attempt LAVH accepting possibility for TAH given the good support of her uterus and 2 prior cesarean sections. Would also plan on salpingectomies prophylactically. Patient would keep her ovaries at her choice for continued hormone production. She understands the risks of issues with the ovaries in the future to include ovarian cancer. Patient wants to think about her options. She'll follow up for her biopsy results.    Dara Lords MD, 9:52  AM 01/02/2016

## 2016-01-03 LAB — URINALYSIS W MICROSCOPIC + REFLEX CULTURE
BACTERIA UA: NONE SEEN [HPF]
Bilirubin Urine: NEGATIVE
Casts: NONE SEEN [LPF]
Crystals: NONE SEEN [HPF]
Glucose, UA: NEGATIVE
KETONES UR: NEGATIVE
LEUKOCYTES UA: NEGATIVE
NITRITE: NEGATIVE
Specific Gravity, Urine: 1.027 (ref 1.001–1.035)
YEAST: NONE SEEN [HPF]
pH: 7.5 (ref 5.0–8.0)

## 2016-01-04 LAB — URINE CULTURE

## 2016-04-29 ENCOUNTER — Ambulatory Visit (INDEPENDENT_AMBULATORY_CARE_PROVIDER_SITE_OTHER): Payer: BLUE CROSS/BLUE SHIELD | Admitting: Family

## 2016-04-29 ENCOUNTER — Encounter: Payer: Self-pay | Admitting: Family

## 2016-04-29 VITALS — BP 110/78 | HR 84 | Temp 98.6°F | Ht 62.5 in | Wt 190.0 lb

## 2016-04-29 DIAGNOSIS — J020 Streptococcal pharyngitis: Secondary | ICD-10-CM | POA: Diagnosis not present

## 2016-04-29 LAB — POCT RAPID STREP A (OFFICE): Rapid Strep A Screen: POSITIVE — AB

## 2016-04-29 MED ORDER — AMOXICILLIN 500 MG PO TABS: ORAL_TABLET | ORAL | 0 refills | Status: DC

## 2016-04-29 NOTE — Progress Notes (Signed)
Pre visit review using our clinic review tool, if applicable. No additional management support is needed unless otherwise documented below in the visit note. 

## 2016-04-29 NOTE — Patient Instructions (Signed)
Pleasure meeting you   Strep Throat Strep throat is a bacterial infection of the throat. Your health care provider may call the infection tonsillitis or pharyngitis, depending on whether there is swelling in the tonsils or at the back of the throat. Strep throat is most common during the cold months of the year in children who are 685-41 years of age, but it can happen during any season in people of any age. This infection is spread from person to person (contagious) through coughing, sneezing, or close contact. What are the causes? Strep throat is caused by the bacteria called Streptococcus pyogenes. What increases the risk? This condition is more likely to develop in:  People who spend time in crowded places where the infection can spread easily.  People who have close contact with someone who has strep throat. What are the signs or symptoms? Symptoms of this condition include:  Fever or chills.  Redness, swelling, or pain in the tonsils or throat.  Pain or difficulty when swallowing.  White or yellow spots on the tonsils or throat.  Swollen, tender glands in the neck or under the jaw.  Red rash all over the body (rare). How is this diagnosed? This condition is diagnosed by performing a rapid strep test or by taking a swab of your throat (throat culture test). Results from a rapid strep test are usually ready in a few minutes, but throat culture test results are available after one or two days. How is this treated? This condition is treated with antibiotic medicine. Follow these instructions at home: Medicines  Take over-the-counter and prescription medicines only as told by your health care provider.  Take your antibiotic as told by your health care provider. Do not stop taking the antibiotic even if you start to feel better.  Have family members who also have a sore throat or fever tested for strep throat. They may need antibiotics if they have the strep infection. Eating and  drinking  Do not share food, drinking cups, or personal items that could cause the infection to spread to other people.  If swallowing is difficult, try eating soft foods until your sore throat feels better.  Drink enough fluid to keep your urine clear or pale yellow. General instructions  Gargle with a salt-water mixture 3-4 times per day or as needed. To make a salt-water mixture, completely dissolve -1 tsp of salt in 1 cup of warm water.  Make sure that all household members wash their hands well.  Get plenty of rest.  Stay home from school or work until you have been taking antibiotics for 24 hours.  Keep all follow-up visits as told by your health care provider. This is important. Contact a health care provider if:  The glands in your neck continue to get bigger.  You develop a rash, cough, or earache.  You cough up a thick liquid that is green, yellow-brown, or bloody.  You have pain or discomfort that does not get better with medicine.  Your problems seem to be getting worse rather than better.  You have a fever. Get help right away if:  You have new symptoms, such as vomiting, severe headache, stiff or painful neck, chest pain, or shortness of breath.  You have severe throat pain, drooling, or changes in your voice.  You have swelling of the neck, or the skin on the neck becomes red and tender.  You have signs of dehydration, such as fatigue, dry mouth, and decreased urination.  You become  increasingly sleepy, or you cannot wake up completely.  Your joints become red or painful. This information is not intended to replace advice given to you by your health care provider. Make sure you discuss any questions you have with your health care provider. Document Released: 05/14/2000 Document Revised: 01/14/2016 Document Reviewed: 09/09/2014 Elsevier Interactive Patient Education  2017 [employer]

## 2016-04-29 NOTE — Progress Notes (Signed)
Subjective:    Patient ID: Donna Pitts, female    DOB: 05/06/1976, 40 y.o.   MRN: [id number]  CC: Donna GhentChristina Pitts is a 40 y.o. female who presents today for an acute visit.    HPI: CC: sore throat x 2 days. Trouble swallowing. No fever, chills, cough, sinus congestion. hasnt tried any medication.      HISTORY:  Past Medical History:  Diagnosis Date  . Plantar fasciitis    Dr. Orland Jarredroxler  . Seasonal allergies    Past Surgical History:  Procedure Laterality Date  . CESAREAN SECTION     X2, pre-ecclampsia, HEELP  . COLONOSCOPY  2012  . ENDOMETRIAL CRYOABLATION  06/25/2011   HEROPTION  . PELVIC LAPAROSCOPY    . TUBAL LIGATION     Family History  Problem Relation Age of Onset  . Hypertension Mother   . Thyroid disease Mother   . Diabetes Father   . Hypertension Father   . Diabetes Paternal Grandmother   . Hypertension Paternal Grandmother   . Breast cancer Paternal Grandmother   . Cancer Paternal Grandmother [id number]    breast  . Diabetes Paternal Grandfather   . Hypertension Paternal Grandfather   . Heart disease Paternal Grandfather [id number]  . Allergies Son   . Heart disease Maternal Grandmother     Allergies: Mobic [meloxicam] and Amoxicillin-pot clavulanate No current outpatient prescriptions on file prior to visit.   No current facility-administered medications on file prior to visit.     Social History  Substance Use Topics  . Smoking status: Never Smoker  . Smokeless tobacco: Never Used  . Alcohol use 0.0 oz/week     Comment: OCC    Review of Systems  Constitutional: Negative for chills and fever.  HENT: Positive for congestion and sore throat.   Respiratory: Negative for cough, shortness of breath and wheezing.   Cardiovascular: Negative for chest pain and palpitations.  Gastrointestinal: Negative for nausea and vomiting.      Objective:    BP 110/78   Pulse 84   Temp 98.6 F (37 C) (Oral)   Ht 5' 2.5" (1.588 m)   Wt 190 lb (86.2 kg)   SpO2  98%   BMI 34.20 kg/m    Physical Exam  Constitutional: She appears well-developed and well-nourished.  HENT:  Head: Normocephalic and atraumatic.  Right Ear: Hearing, tympanic membrane, external ear and ear canal normal. No drainage, swelling or tenderness. No foreign bodies. Tympanic membrane is not erythematous and not bulging. No middle ear effusion. No decreased hearing is noted.  Left Ear: Hearing, tympanic membrane, external ear and ear canal normal. No drainage, swelling or tenderness. No foreign bodies. Tympanic membrane is not erythematous and not bulging.  No middle ear effusion. No decreased hearing is noted.  Nose: Nose normal. No rhinorrhea. Right sinus exhibits no maxillary sinus tenderness and no frontal sinus tenderness. Left sinus exhibits no maxillary sinus tenderness and no frontal sinus tenderness.  Mouth/Throat: Uvula is midline and mucous membranes are normal. Posterior oropharyngeal erythema present. No oropharyngeal exudate, posterior oropharyngeal edema or tonsillar abscesses.  Eyes: Conjunctivae are normal.  Cardiovascular: Regular rhythm, normal heart sounds and normal pulses.   Pulmonary/Chest: Effort normal and breath sounds normal. She has no wheezes. She has no rhonchi. She has no rales.  Lymphadenopathy:       Head (right side): No submental, no submandibular, no tonsillar, no preauricular, no posterior auricular and no occipital adenopathy present.       Head (left side):  No submental, no submandibular, no tonsillar, no preauricular, no posterior auricular and no occipital adenopathy present.    She has no cervical adenopathy.  Neurological: She is alert.  Skin: Skin is warm and dry.  Psychiatric: She has a normal mood and affect. Her speech is normal and behavior is normal. Thought content normal.  Vitals reviewed.      Assessment & Plan:   1. Streptococcal sore throat Strep positive. Afebrile. Return precautions given. - POCT rapid strep A -  amoxicillin (AMOXIL) 500 MG tablet; Take two tablets ( total 1000 mg) PO q24h for 10 days  Dispense: 20 tablet; Refill: 0    I am having Ms. Reedy start on amoxicillin.   Meds ordered this encounter  Medications  . amoxicillin (AMOXIL) 500 MG tablet    Sig: Take two tablets ( total 1000 mg) PO q24h for 10 days    Dispense:  20 tablet    Refill:  0    Order Specific Question:   Supervising Provider    Answer:   Sherlene ShamsULLO, TERESA L [[id number]]    Return precautions given.   Risks, benefits, and alternatives of the medications and treatment plan prescribed today were discussed, and patient expressed understanding.   Education regarding symptom management and diagnosis given to patient on AVS.  Continue to follow with Donna PlowmanMargaret Tyquarius Paglia, FNP for routine health maintenance.   Donna Ghenthristina Bruna and I agreed with plan.   Donna PlowmanMargaret Donna Lindler, FNP

## 2016-05-03 ENCOUNTER — Telehealth: Payer: Self-pay | Admitting: Family

## 2016-05-03 DIAGNOSIS — J020 Streptococcal pharyngitis: Secondary | ICD-10-CM

## 2016-05-03 MED ORDER — AZITHROMYCIN 250 MG PO TABS: ORAL_TABLET | ORAL | 0 refills | Status: DC

## 2016-05-03 NOTE — Telephone Encounter (Signed)
Patient has been notified

## 2016-05-03 NOTE — Telephone Encounter (Signed)
Please advise 

## 2016-05-03 NOTE — Telephone Encounter (Signed)
Pt saw Arnett on Thursday. She was given a antibiotic, it is not working. She is requesting a stronger antibiotic. Please call her at [phone].

## 2016-05-03 NOTE — Telephone Encounter (Signed)
Sent in new rx.  Dc/ed amoxicillin

## 2016-06-03 ENCOUNTER — Encounter: Payer: Self-pay | Admitting: Family Medicine

## 2016-06-03 ENCOUNTER — Ambulatory Visit (INDEPENDENT_AMBULATORY_CARE_PROVIDER_SITE_OTHER): Payer: BLUE CROSS/BLUE SHIELD | Admitting: Family Medicine

## 2016-06-03 VITALS — BP 112/80 | HR 75 | Temp 98.0°F | Wt 190.6 lb

## 2016-06-03 DIAGNOSIS — R300 Dysuria: Secondary | ICD-10-CM

## 2016-06-03 DIAGNOSIS — N3001 Acute cystitis with hematuria: Secondary | ICD-10-CM | POA: Diagnosis not present

## 2016-06-03 DIAGNOSIS — N390 Urinary tract infection, site not specified: Secondary | ICD-10-CM | POA: Insufficient documentation

## 2016-06-03 LAB — POCT URINALYSIS DIPSTICK
Bilirubin, UA: NEGATIVE
GLUCOSE UA: NEGATIVE
Ketones, UA: NEGATIVE
Leukocytes, UA: NEGATIVE
NITRITE UA: NEGATIVE
PH UA: 6
PROTEIN UA: POSITIVE
RBC UA: POSITIVE
Spec Grav, UA: >=1.03
UROBILINOGEN UA: 0.2

## 2016-06-03 LAB — URINALYSIS, MICROSCOPIC ONLY

## 2016-06-03 MED ORDER — PHENAZOPYRIDINE HCL 200 MG PO TABS: 200.0000 mg | ORAL_TABLET | Freq: Three times a day (TID) | ORAL | 0 refills | Status: DC

## 2016-06-03 MED ORDER — NITROFURANTOIN MONOHYD MACRO 100 MG PO CAPS: 100.0000 mg | ORAL_CAPSULE | Freq: Two times a day (BID) | ORAL | 0 refills | Status: DC

## 2016-06-03 NOTE — Progress Notes (Signed)
Pre visit review using our clinic review tool, if applicable. No additional management support is needed unless otherwise documented below in the visit note. 

## 2016-06-03 NOTE — Progress Notes (Signed)
  Yarah Fuente, MD Phone: [phone]  Donna GhentChristina Pitts is a 41 y.o.Donna Pitts female who presents today for same-day visit.  Patient notes 1-2 days of dysuria, urinary urgency, and possible clots of blood in her urine. No fevers. No abdominal pain. Some urinary frequency. No vaginal discharge. Started feeling uncomfortable yesterday and has progressed. Took Azo and took a dose of Cipro and ibuprofen yesterday. Azo did not help.   ROS see history of present illness  Objective  Physical Exam Vitals:   06/03/16 0806  BP: 112/80  Pulse: 75  Temp: 98 F (36.7 C)    BP Readings from Last 3 Encounters:  06/03/16 112/80  04/29/16 110/78  01/02/16 124/80   Wt Readings from Last 3 Encounters:  06/03/16 190 lb 9.6 oz (86.5 kg)  04/29/16 190 lb (86.2 kg)  12/09/15 188 lb (85.3 kg)    Physical Exam  Constitutional: No distress.  Cardiovascular: Normal rate, regular rhythm and normal heart sounds.   Pulmonary/Chest: Effort normal and breath sounds normal.  Abdominal: Soft. Bowel sounds are normal. She exhibits no distension. There is tenderness (minimal suprapubic tenderness). There is no rebound and no guarding.  Neurological: She is alert. Gait normal.  Skin: She is not diaphoretic.     Assessment/Plan: Please see individual problem list.  UTI (urinary tract infection) Symptoms and UA consistent with UTI. She did take a dose of Cipro so not surprising the leukocytes and nitrites are negative. We'll send urine for culture. Treat with Macrobid. Pyridium for discomfort. Given return precautions.   Orders Placed This Encounter  Procedures  . Urine Culture  . Urine Microscopic Only    Meds ordered this encounter  Medications  . nitrofurantoin, macrocrystal-monohydrate, (MACROBID) 100 MG capsule    Sig: Take 1 capsule (100 mg total) by mouth 2 (two) times daily.    Dispense:  14 capsule    Refill:  0  . phenazopyridine (PYRIDIUM) 200 MG tablet    Sig: Take 1 tablet (200 mg  total) by mouth 3 (three) times daily with meals.    Dispense:  10 tablet    Refill:  0   Donna AlarEric Tharun Cappella, MD [facility] [place] [facility]

## 2016-06-03 NOTE — Patient Instructions (Signed)
Nice to meet you. You likely have a UTI. We will treat you with Macrobid. You can take the Pyridium for urinary discomfort. If you develop fevers, abdominal pain, or any new or changing symptoms please seek medical attention medially.

## 2016-06-03 NOTE — Assessment & Plan Note (Signed)
Symptoms and UA consistent with UTI. She did take a dose of Cipro so not surprising the leukocytes and nitrites are negative. We'll send urine for culture. Treat with Macrobid. Pyridium for discomfort. Given return precautions.

## 2016-06-03 NOTE — Addendum Note (Signed)
Addended by: Inetta FermoHENDRICKS, JESSICA S on: 06/03/2016 08:28 AM   Modules accepted: Orders

## 2016-06-05 ENCOUNTER — Telehealth: Payer: Self-pay | Admitting: Family Medicine

## 2016-06-05 DIAGNOSIS — R319 Hematuria, unspecified: Secondary | ICD-10-CM

## 2016-06-05 LAB — URINE CULTURE: Organism ID, Bacteria: NO GROWTH

## 2016-06-05 NOTE — Telephone Encounter (Signed)
Lab only or follow up as well?

## 2016-06-05 NOTE — Telephone Encounter (Signed)
Called and spoke with patient regarding results. Advised that her urine culture didn't grow any bacteria. She reports that she has noticed a significant difference since being on the Macrobid. I suspect the urine culture didn't grow any bacteria given that she took a dose of Cipro prior to coming into the office. She does report that she passed a fair number of clots in her urine on Friday though this has improved. We will have her finish the course of Macrobid. We will have her return after she has finished the course of Macrobid to recheck her urine given the clots and the blood in her urine. We will have the CMA contact the patient on Monday to get her set up for an appointment for Friday or early the following week.

## 2016-06-06 NOTE — Telephone Encounter (Signed)
Lab only 

## 2016-06-07 NOTE — Telephone Encounter (Signed)
Left message to return call to set up follow up lab

## 2016-06-08 NOTE — Addendum Note (Signed)
Addended by: Birdie SonsSONNENBERG, Meet [place] G on: 06/08/2016 01:13 PM   Modules accepted: Orders

## 2016-06-08 NOTE — Telephone Encounter (Signed)
Patient states she will start her cycle any day and would like to check her urine after. Patient is scheduled for a UA on 06/21/16 pleas place order as future.

## 2016-06-08 NOTE — Telephone Encounter (Signed)
Order placed

## 2016-06-21 ENCOUNTER — Other Ambulatory Visit (INDEPENDENT_AMBULATORY_CARE_PROVIDER_SITE_OTHER): Payer: BLUE CROSS/BLUE SHIELD

## 2016-06-21 DIAGNOSIS — R319 Hematuria, unspecified: Secondary | ICD-10-CM | POA: Diagnosis not present

## 2016-06-21 LAB — URINALYSIS, MICROSCOPIC ONLY

## 2016-06-21 LAB — POCT URINALYSIS DIPSTICK
BILIRUBIN UA: NEGATIVE
Blood, UA: NEGATIVE
Glucose, UA: NEGATIVE
KETONES UA: NEGATIVE
Leukocytes, UA: NEGATIVE
Nitrite, UA: NEGATIVE
PROTEIN UA: 30
Urobilinogen, UA: 0.2
pH, UA: 5

## 2017-08-31 ENCOUNTER — Other Ambulatory Visit: Payer: Self-pay | Admitting: Family Medicine

## 2017-08-31 DIAGNOSIS — Z1231 Encounter for screening mammogram for malignant neoplasm of breast: Secondary | ICD-10-CM

## 2017-09-27 ENCOUNTER — Ambulatory Visit
Admission: RE | Admit: 2017-09-27 | Discharge: 2017-09-27 | Disposition: A | Discharge status: Home / Self Care | Payer: Managed Care, Other (non HMO) | Source: Ambulatory Visit | Attending: Family Medicine | Admitting: Family Medicine

## 2017-09-27 DIAGNOSIS — Z1231 Encounter for screening mammogram for malignant neoplasm of breast: Secondary | ICD-10-CM

## 2018-09-12 ENCOUNTER — Ambulatory Visit
Admission: RE | Admit: 2018-09-12 | Discharge: 2018-09-12 | Disposition: A | Discharge status: Home / Self Care | Payer: Managed Care, Other (non HMO) | Attending: Family Medicine | Admitting: Family Medicine

## 2018-09-12 ENCOUNTER — Other Ambulatory Visit: Payer: Self-pay

## 2018-09-12 ENCOUNTER — Other Ambulatory Visit: Payer: Self-pay | Admitting: Family Medicine

## 2018-09-12 ENCOUNTER — Ambulatory Visit
Admission: RE | Admit: 2018-09-12 | Discharge: 2018-09-12 | Disposition: A | Discharge status: Home / Self Care | Payer: Managed Care, Other (non HMO) | Source: Ambulatory Visit | Attending: Family Medicine | Admitting: Family Medicine

## 2018-09-12 DIAGNOSIS — M25562 Pain in left knee: Principal | ICD-10-CM

## 2018-09-12 DIAGNOSIS — G8929 Other chronic pain: Secondary | ICD-10-CM | POA: Diagnosis present

## 2018-09-21 ENCOUNTER — Other Ambulatory Visit: Payer: Self-pay | Admitting: Family Medicine

## 2018-09-21 DIAGNOSIS — M25362 Other instability, left knee: Secondary | ICD-10-CM

## 2018-09-21 DIAGNOSIS — G8929 Other chronic pain: Secondary | ICD-10-CM

## 2018-09-21 DIAGNOSIS — M25562 Pain in left knee: Secondary | ICD-10-CM

## 2018-09-29 ENCOUNTER — Other Ambulatory Visit: Payer: Self-pay | Admitting: Family Medicine

## 2018-09-29 DIAGNOSIS — Z1231 Encounter for screening mammogram for malignant neoplasm of breast: Secondary | ICD-10-CM

## 2018-10-10 ENCOUNTER — Other Ambulatory Visit: Payer: Self-pay

## 2018-10-10 ENCOUNTER — Ambulatory Visit
Admission: RE | Admit: 2018-10-10 | Discharge: 2018-10-10 | Disposition: A | Discharge status: Home / Self Care | Payer: Managed Care, Other (non HMO) | Source: Ambulatory Visit | Attending: Family Medicine | Admitting: Family Medicine

## 2018-10-10 DIAGNOSIS — M25362 Other instability, left knee: Secondary | ICD-10-CM

## 2018-10-10 DIAGNOSIS — G8929 Other chronic pain: Secondary | ICD-10-CM

## 2018-10-10 DIAGNOSIS — M25562 Pain in left knee: Secondary | ICD-10-CM

## 2018-11-24 ENCOUNTER — Ambulatory Visit: Payer: Managed Care, Other (non HMO)

## 2019-01-03 ENCOUNTER — Ambulatory Visit
Admission: RE | Admit: 2019-01-03 | Discharge: 2019-01-03 | Disposition: A | Discharge status: Home / Self Care | Payer: Managed Care, Other (non HMO) | Source: Ambulatory Visit | Attending: Family Medicine | Admitting: Family Medicine

## 2019-01-03 ENCOUNTER — Other Ambulatory Visit: Payer: Self-pay

## 2019-01-03 DIAGNOSIS — Z1231 Encounter for screening mammogram for malignant neoplasm of breast: Secondary | ICD-10-CM

## 2019-01-15 ENCOUNTER — Other Ambulatory Visit: Payer: Self-pay

## 2019-01-16 ENCOUNTER — Encounter: Payer: Self-pay | Admitting: Gynecology

## 2019-01-16 ENCOUNTER — Ambulatory Visit: Payer: Managed Care, Other (non HMO) | Admitting: Gynecology

## 2019-01-16 VITALS — BP 120/76 | Ht 62.0 in | Wt 189.0 lb

## 2019-01-16 DIAGNOSIS — L290 Pruritus ani: Secondary | ICD-10-CM

## 2019-01-16 DIAGNOSIS — N898 Other specified noninflammatory disorders of vagina: Secondary | ICD-10-CM | POA: Diagnosis not present

## 2019-01-16 DIAGNOSIS — Z13220 Encounter for screening for lipoid disorders: Secondary | ICD-10-CM | POA: Diagnosis not present

## 2019-01-16 DIAGNOSIS — L680 Hirsutism: Secondary | ICD-10-CM | POA: Diagnosis not present

## 2019-01-16 DIAGNOSIS — Z01419 Encounter for gynecological examination (general) (routine) without abnormal findings: Secondary | ICD-10-CM | POA: Diagnosis not present

## 2019-01-16 LAB — WET PREP FOR TRICH, YEAST, CLUE

## 2019-01-16 MED ORDER — FLUCONAZOLE 150 MG PO TABS: 150.0000 mg | ORAL_TABLET | Freq: Every day | ORAL | 0 refills | Status: DC

## 2019-01-16 MED ORDER — NYSTATIN 100000 UNIT/GM EX CREA: 1.0000 "application " | TOPICAL_CREAM | Freq: Two times a day (BID) | CUTANEOUS | 2 refills | Status: DC

## 2019-01-16 NOTE — Patient Instructions (Signed)
Take the Diflucan pill daily for 5 days.  Add the nystatin cream to the triamcinolone cream and apply this to the rash area in the peri-anal region several times daily as needed.  Follow-up if your symptoms persist.  Follow-up in 1 year for annual exam.

## 2019-01-16 NOTE — Progress Notes (Signed)
    Donna Pitts 01/01/1976 [id number]        43 y.o.  G2P2 for annual gynecologic exam.  Has not been in the office for 3 years.  Had been evaluated for menorrhagia although notes that her periods have lightened and are acceptable.  Does note some hair growth on her chin and upper lip that she is using laser for.  Also has been dealing with irritation and itching in the perianal region.  Her primary provider gave her triamcinolone cream.  Past medical history,surgical history, problem list, medications, allergies, family history and social history were all reviewed and documented as reviewed in the EPIC chart.  ROS:  Performed with pertinent positives and negatives included in the history, assessment and plan.   Additional significant findings : None   Exam: Caryn Bee assistant Vitals:   01/16/19 0758  BP: 120/76  Weight: 189 lb (85.7 kg)  Height: 5\' 2"  (1.575 m)   Body mass index is 34.57 kg/m.  General appearance:  Normal affect, orientation and appearance. Skin: Grossly normal HEENT: Without gross lesions.  No cervical or supraclavicular adenopathy. Thyroid normal.  Lungs:  Clear without wheezing, rales or rhonchi Cardiac: RR, without RMG Abdominal:  Soft, nontender, without masses, guarding, rebound, organomegaly or hernia Breasts:  Examined lying and sitting without masses, retractions, discharge or axillary adenopathy. Pelvic:  Ext, BUS, Vagina: Normal without significant discharge.  Wet prep done  Cervix: Normal  Uterus: Anteverted, normal size, shape and contour, midline and mobile nontender   Adnexa: Without masses or tenderness    Anus and perineum: With erythematous perianal rash consistent with fungal  Rectovaginal: Normal sphincter tone without palpated masses or tenderness.    Assessment/Plan:  43 y.o. G2P2 female for annual gynecologic exam.  With regular menses, tubal sterilization  1. History of menorrhagia.  Status post ablation.  Menses are better and  manageable. 2. Hair growth chin and upper lips.  No evidence of that now although she is using laser.  No other areas of hair growth concern.  Also notes some mild thinning of her hair.  Exam shows normal scalp.  No chest hair or evidence of masculinization.  Will check baseline testosterone levels.  Discussed options to include ovarian suppression such as low-dose oral contraceptives, spironolactone and topical applications.  At this point is not a major issue and she will continue with laser as needed. 3. Perianal rash consistent with fungal.  Wet prep is positive for yeast.  Will add nystatin ointment along with the triamcinolone.  Will cover with Diflucan 150 mg daily x5 days to treat rectal reserve. 4. Pap smear/HPV 11/2015.  No Pap smear done today.  No history of abnormal Pap smears previously.  Plan repeat Pap smear/HPV at 5-year interval per current screening guidelines. 5. Mammography 12/2018.  Continue with annual mammography next year.  Breast exam normal today. 6. Health maintenance.  Requests baseline labs.  CBC, CMP, lipid profile, TSH ordered.  Follow-up 1 year, sooner as needed.  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   Anastasio Auerbach MD, 8:25 AM 01/16/2019

## 2019-01-20 LAB — CBC WITH DIFFERENTIAL/PLATELET
Absolute Monocytes: 409 cells/uL (ref 200–950)
Basophils Absolute: 28 cells/uL (ref 0–200)
Basophils Relative: 0.5 %
Eosinophils Absolute: 112 cells/uL (ref 15–500)
Eosinophils Relative: 2 %
HCT: 37.1 % (ref 35.0–45.0)
Hemoglobin: 12.5 g/dL (ref 11.7–15.5)
Lymphs Abs: 2033 cells/uL (ref 850–3900)
MCH: 31.5 pg (ref 27.0–33.0)
MCHC: 33.7 g/dL (ref 32.0–36.0)
MCV: 93.5 fL (ref 80.0–100.0)
MPV: 8.9 fL (ref 7.5–12.5)
Monocytes Relative: 7.3 %
Neutro Abs: 3018 cells/uL (ref 1500–7800)
Neutrophils Relative %: 53.9 %
Platelets: 304 10*3/uL (ref 140–400)
RBC: 3.97 10*6/uL (ref 3.80–5.10)
RDW: 12.4 % (ref 11.0–15.0)
Total Lymphocyte: 36.3 %
WBC: 5.6 10*3/uL (ref 3.8–10.8)

## 2019-01-20 LAB — COMPREHENSIVE METABOLIC PANEL
AG Ratio: 1.6 (calc) (ref 1.0–2.5)
ALT: 21 U/L (ref 6–29)
AST: 15 U/L (ref 10–30)
Albumin: 4.2 g/dL (ref 3.6–5.1)
Alkaline phosphatase (APISO): 71 U/L (ref 31–125)
BUN: 8 mg/dL (ref 7–25)
CO2: 23 mmol/L (ref 20–32)
Calcium: 9.3 mg/dL (ref 8.6–10.2)
Chloride: 105 mmol/L (ref 98–110)
Creat: 0.67 mg/dL (ref 0.50–1.10)
Globulin: 2.6 g/dL (calc) (ref 1.9–3.7)
Glucose, Bld: 105 mg/dL — ABNORMAL HIGH (ref 65–99)
Potassium: 4.8 mmol/L (ref 3.5–5.3)
Sodium: 137 mmol/L (ref 135–146)
Total Bilirubin: 0.4 mg/dL (ref 0.2–1.2)
Total Protein: 6.8 g/dL (ref 6.1–8.1)

## 2019-01-20 LAB — LIPID PANEL
Cholesterol: 189 mg/dL (ref <200)
HDL: 59 mg/dL (ref >=50)
LDL Cholesterol (Calc): 114 mg/dL (calc) — ABNORMAL HIGH
Non-HDL Cholesterol (Calc): 130 mg/dL (calc) — ABNORMAL HIGH (ref <130)
Total CHOL/HDL Ratio: 3.2 (calc) (ref <5.0)
Triglycerides: 67 mg/dL (ref <150)

## 2019-01-20 LAB — TESTOS,TOTAL,FREE AND SHBG (FEMALE)
Free Testosterone: 2.5 pg/mL (ref 0.1–6.4)
Sex Hormone Binding: 23 nmol/L (ref 17–124)
Testosterone, Total, LC-MS-MS: 15 ng/dL (ref 2–45)

## 2019-01-20 LAB — TSH: TSH: 1.75 mIU/L

## 2019-01-22 ENCOUNTER — Encounter: Payer: Self-pay | Admitting: Gynecology

## 2019-02-20 ENCOUNTER — Encounter: Payer: Self-pay | Admitting: Gynecology

## 2019-12-24 ENCOUNTER — Other Ambulatory Visit: Payer: Self-pay | Admitting: Family Medicine

## 2019-12-24 DIAGNOSIS — Z1231 Encounter for screening mammogram for malignant neoplasm of breast: Secondary | ICD-10-CM

## 2020-01-04 ENCOUNTER — Other Ambulatory Visit: Payer: Self-pay

## 2020-01-04 ENCOUNTER — Ambulatory Visit
Admission: RE | Admit: 2020-01-04 | Discharge: 2020-01-04 | Disposition: A | Discharge status: Home / Self Care | Payer: Managed Care, Other (non HMO) | Source: Ambulatory Visit | Attending: Family Medicine | Admitting: Family Medicine

## 2020-01-04 DIAGNOSIS — Z1231 Encounter for screening mammogram for malignant neoplasm of breast: Secondary | ICD-10-CM

## 2020-03-07 ENCOUNTER — Other Ambulatory Visit: Payer: Self-pay

## 2020-03-07 ENCOUNTER — Encounter: Payer: Self-pay | Admitting: Obstetrics & Gynecology

## 2020-03-07 ENCOUNTER — Ambulatory Visit: Payer: Managed Care, Other (non HMO) | Admitting: Obstetrics & Gynecology

## 2020-03-07 VITALS — BP 118/80 | Ht 62.0 in | Wt 158.0 lb

## 2020-03-07 DIAGNOSIS — Z23 Encounter for immunization: Secondary | ICD-10-CM

## 2020-03-07 DIAGNOSIS — Z9851 Tubal ligation status: Secondary | ICD-10-CM

## 2020-03-07 DIAGNOSIS — Z01419 Encounter for gynecological examination (general) (routine) without abnormal findings: Secondary | ICD-10-CM

## 2020-03-07 NOTE — Progress Notes (Signed)
Donna Pitts July 31, 1975 [id number]   History:    44 y.o. G2P2L2 Married. Son 46 and daughter 45. [place].  RP: Established patient for annual gynecologic exam  HPI: Normal menstrual periods every month with improved flow post endometrial ablation.  Status post bilateral tubal ligation.  No pelvic pain, except for ovulatory pain.  Urine and bowel movements normal.  Breasts normal.  Body mass index 28.9.  Improving fitness and adjusting nutrition.  Health labs with family physician.  Past medical history,surgical history, family history and social history were all reviewed and documented in the EPIC chart.  Gynecologic History Patient's last menstrual period was 02/23/2020.  Obstetric History OB History  Gravida Para Term Preterm AB Living  2 2       2   SAB TAB Ectopic Multiple Live Births               # Outcome Date GA Lbr Len/2nd Weight Sex Delivery Anes PTL Lv  2 Para           1 Para              ROS: A ROS was performed and pertinent positives and negatives are included in the history.  GENERAL: No fevers or chills. HEENT: No change in vision, no earache, sore throat or sinus congestion. NECK: No pain or stiffness. CARDIOVASCULAR: No chest pain or pressure. No palpitations. PULMONARY: No shortness of breath, cough or wheeze. GASTROINTESTINAL: No abdominal pain, nausea, vomiting or diarrhea, melena or bright red blood per rectum. GENITOURINARY: No urinary frequency, urgency, hesitancy or dysuria. MUSCULOSKELETAL: No joint or muscle pain, no back pain, no recent trauma. DERMATOLOGIC: No rash, no itching, no lesions. ENDOCRINE: No polyuria, polydipsia, no heat or cold intolerance. No recent change in weight. HEMATOLOGICAL: No anemia or easy bruising or bleeding. NEUROLOGIC: No headache, seizures, numbness, tingling or weakness. PSYCHIATRIC: No depression, no loss of interest in normal activity or change in sleep pattern.     Exam:   BP 118/80 (BP Location: Right Arm,  Patient Position: Sitting, Cuff Size: Normal)   Ht 5\' 2"  (1.575 m)   Wt 158 lb (71.7 kg)   LMP 02/23/2020   BMI 28.90 kg/m   Body mass index is 28.9 kg/m.  General appearance : Well developed well nourished female. No acute distress HEENT: Eyes: no retinal hemorrhage or exudates,  Neck supple, trachea midline, no carotid bruits, no thyroidmegaly Lungs: Clear to auscultation, no rhonchi or wheezes, or rib retractions  Heart: Regular rate and rhythm, no murmurs or gallops Breast:Examined in sitting and supine position were symmetrical in appearance, no palpable masses or tenderness,  no skin retraction, no nipple inversion, no nipple discharge, no skin discoloration, no axillary or supraclavicular lymphadenopathy Abdomen: no palpable masses or tenderness, no rebound or guarding Extremities: no edema or skin discoloration or tenderness  Pelvic: Vulva: Normal             Vagina: No gross lesions or discharge  Cervix: No gross lesions or discharge.  Pap reflex done.  Uterus  AV, normal size, shape and consistency, non-tender and mobile  Adnexa  Without masses or tenderness  Anus: Normal   Assessment/Plan:  44 y.o. female for annual exam   1. Encounter for routine gynecological examination with Papanicolaou smear of cervix Normal gynecologic exam.  Menses regular with normal flow post endometrial ablation.  Pap reflex done.  Breast exam normal.  Screening mammogram August 2021 was negative.  Body mass index 28.9.  Patient  is currently improving her fitness and adjusting her nutrition.  Health labs with family physician.  2. S/P tubal ligation  3. Needs flu shot Flu shot given today.  Other orders - fexofenadine (ALLEGRA) 180 MG tablet; Take 180 mg by mouth daily.  Genia Del MD, 9:08 AM 03/07/2020

## 2020-03-07 NOTE — Addendum Note (Signed)
Addended by: Tito Dine on: 03/07/2020 09:55 AM   Modules accepted: Orders

## 2020-03-11 LAB — PAP IG W/ RFLX HPV ASCU

## 2020-08-12 ENCOUNTER — Telehealth: Payer: Self-pay

## 2020-08-12 NOTE — Telephone Encounter (Addendum)
Patient called in voice mail asking if it might be possible she could have IUD inserted to minimize cramps. She said she is having cramps with menses but has even worse cramps when she ovulates. She is post endometrial ablation and has tubal ligation.   I recommended office visit to discuss with Dr. Mackey Birchwood.  Staff message sent to Lupita Leash at appts to call her to schedule a visit.

## 2020-08-14 ENCOUNTER — Telehealth: Payer: Self-pay

## 2020-08-14 MED ORDER — FLUCONAZOLE 150 MG PO TABS: ORAL_TABLET | ORAL | 0 refills | Status: DC

## 2020-08-14 NOTE — Telephone Encounter (Signed)
Fluconazole 150 mg 1 tab PO daily x 3.  Refill x 3.

## 2020-08-14 NOTE — Telephone Encounter (Signed)
Rx sent 

## 2020-08-14 NOTE — Telephone Encounter (Signed)
Late yesterday started with vaginal itching. Overnight it became terrible itching intravaginally. She had on Monistat ovule on hand and inserted that and used the topical cream.  This morning she found an expired (2020) Diflucan and took it. She has no discharge or odor. Symptoms are much better today but she is leaving to travel in a week and would like to get Rx to cover her for this to either treat now or take with her just in case this did not treat it properly.

## 2021-01-13 ENCOUNTER — Other Ambulatory Visit: Payer: Self-pay | Admitting: Family Medicine

## 2021-01-13 DIAGNOSIS — Z1231 Encounter for screening mammogram for malignant neoplasm of breast: Secondary | ICD-10-CM

## 2021-01-16 ENCOUNTER — Other Ambulatory Visit: Payer: Self-pay

## 2021-01-16 ENCOUNTER — Ambulatory Visit
Admission: RE | Admit: 2021-01-16 | Discharge: 2021-01-16 | Disposition: A | Discharge status: Home / Self Care | Payer: Managed Care, Other (non HMO) | Source: Ambulatory Visit

## 2021-01-16 DIAGNOSIS — Z1231 Encounter for screening mammogram for malignant neoplasm of breast: Secondary | ICD-10-CM

## 2021-03-13 ENCOUNTER — Other Ambulatory Visit: Payer: Self-pay

## 2021-03-13 ENCOUNTER — Encounter: Payer: Self-pay | Admitting: Obstetrics & Gynecology

## 2021-03-13 ENCOUNTER — Ambulatory Visit (INDEPENDENT_AMBULATORY_CARE_PROVIDER_SITE_OTHER): Payer: Managed Care, Other (non HMO) | Admitting: Obstetrics & Gynecology

## 2021-03-13 VITALS — BP 104/80 | HR 68 | Resp 16 | Ht 61.75 in | Wt 145.0 lb

## 2021-03-13 DIAGNOSIS — Z9851 Tubal ligation status: Secondary | ICD-10-CM | POA: Diagnosis not present

## 2021-03-13 DIAGNOSIS — N920 Excessive and frequent menstruation with regular cycle: Secondary | ICD-10-CM

## 2021-03-13 DIAGNOSIS — Z01419 Encounter for gynecological examination (general) (routine) without abnormal findings: Secondary | ICD-10-CM | POA: Diagnosis not present

## 2021-03-13 MED ORDER — NORETHIN ACE-ETH ESTRAD-FE 1-20 MG-MCG(24) PO TABS: 1.0000 | ORAL_TABLET | Freq: Every day | ORAL | 4 refills | Status: AC

## 2021-03-13 NOTE — Progress Notes (Signed)
Donna Pitts June 13, 1975 [id number]   History:    45 y.o.  G2P2L2 Married. S/P TL.  Son 85 and daughter 36. [place].   RP: Established patient for annual gynecologic exam   HPI: Menstrual periods every month with re-increase in flow since endometrial ablation.  Status post bilateral tubal ligation.  No pelvic pain, except for ovulatory pain.  Urine and bowel movements normal.  Breasts normal.  Body mass index decreased to 26.74.  Improving fitness and adjusting nutrition.  Health labs with family physician.  Planning Tummy Tuck with Liposuction.    Past medical history,surgical history, family history and social history were all reviewed and documented in the EPIC chart.  Gynecologic History Patient's last menstrual period was 03/11/2021 (exact date).  Obstetric History OB History  Gravida Para Term Preterm AB Living  2 2       2   SAB IAB Ectopic Multiple Live Births               # Outcome Date GA Lbr Len/2nd Weight Sex Delivery Anes PTL Lv  2 Para           1 Para              ROS: A ROS was performed and pertinent positives and negatives are included in the history.  GENERAL: No fevers or chills. HEENT: No change in vision, no earache, sore throat or sinus congestion. NECK: No pain or stiffness. CARDIOVASCULAR: No chest pain or pressure. No palpitations. PULMONARY: No shortness of breath, cough or wheeze. GASTROINTESTINAL: No abdominal pain, nausea, vomiting or diarrhea, melena or bright red blood per rectum. GENITOURINARY: No urinary frequency, urgency, hesitancy or dysuria. MUSCULOSKELETAL: No joint or muscle pain, no back pain, no recent trauma. DERMATOLOGIC: No rash, no itching, no lesions. ENDOCRINE: No polyuria, polydipsia, no heat or cold intolerance. No recent change in weight. HEMATOLOGICAL: No anemia or easy bruising or bleeding. NEUROLOGIC: No headache, seizures, numbness, tingling or weakness. PSYCHIATRIC: No depression, no loss of interest in normal activity or  change in sleep pattern.     Exam:   BP 104/80   Pulse 68   Resp 16   Ht 5' 1.75" (1.568 m)   Wt 145 lb (65.8 kg)   LMP 03/11/2021 (Exact Date) Comment: btl  BMI 26.74 kg/m   Body mass index is 26.74 kg/m.  General appearance : Well developed well nourished female. No acute distress HEENT: Eyes: no retinal hemorrhage or exudates,  Neck supple, trachea midline, no carotid bruits, no thyroidmegaly Lungs: Clear to auscultation, no rhonchi or wheezes, or rib retractions  Heart: Regular rate and rhythm, no murmurs or gallops Breast:Examined in sitting and supine position were symmetrical in appearance, no palpable masses or tenderness,  no skin retraction, no nipple inversion, no nipple discharge, no skin discoloration, no axillary or supraclavicular lymphadenopathy Abdomen: no palpable masses or tenderness, no rebound or guarding Extremities: no edema or skin discoloration or tenderness  Pelvic: Vulva: Normal             Vagina: No gross lesions or discharge  Cervix: No gross lesions or discharge  Uterus  AV, normal size, shape and consistency, non-tender and mobile  Adnexa  Without masses or tenderness  Anus:  Normal   Assessment/Plan:  45 y.o. female for annual exam   1. Well female exam with routine gynecological exam Normal gynecologic exam.  No indication for Pap test this year.  Last Pap test October 2021 was negative.  Breast  exam normal.  Screening mammogram in August 2022 was negative.  Body mass index 26.74.  Continue with fitness and healthy nutrition.  2. S/P tubal ligation  3. Menorrhagia with regular cycle Decision to start on a low-dose birth control pill to control the cycle.  The generic of Loestrin 24 FE 1/20 prescribed.  No contraindication.  Usage, benefits and risks reviewed.  Other orders - valACYclovir (VALTREX) 1000 MG tablet; SMARTSIG:2 Tablet(s) By Mouth Every 12 Hours - triamcinolone cream (KENALOG) 0.1 %; Apply topically 2 (two) times daily as  needed. - econazole nitrate 1 % cream; Apply topically 2 (two) times daily. - Norethindrone Acetate-Ethinyl Estrad-FE (LOESTRIN 24 FE) 1-20 MG-MCG(24) tablet; Take 1 tablet by mouth daily.   Genia Del MD, 8:32 AM 03/13/2021

## 2021-04-09 ENCOUNTER — Telehealth: Payer: Self-pay

## 2021-04-09 NOTE — Telephone Encounter (Signed)
Patient said at her October visit she was prescribed Loestrin 24 Fe to minimize bleeding. She is taking only active OC's no placebos.  She said she has just finished her first pack and skipped placebos and started on a new pack and she has been bleeding for the last 6 days. This is her first pack.

## 2021-04-13 NOTE — Telephone Encounter (Signed)
Spoke with patient and informed her. °

## 2021-04-13 NOTE — Telephone Encounter (Signed)
Genia Del, MD  You Yesterday (9:59 AM)   Recommend to stop the pills x 4 days then restart continuously.

## 2022-03-26 ENCOUNTER — Ambulatory Visit: Payer: Managed Care, Other (non HMO) | Admitting: Obstetrics & Gynecology

## 2022-06-15 IMAGING — MG MM DIGITAL SCREENING BILAT W/ TOMO AND CAD
8 series · 8 of 24 positions shown · non-contrast
Comparison: Previous exam(s).

CLINICAL DATA: Screening.

EXAM:
DIGITAL SCREENING BILATERAL MAMMOGRAM WITH TOMOSYNTHESIS AND CAD
TECHNIQUE: Bilateral screening digital craniocaudal and mediolateral oblique
mammograms were obtained. Bilateral screening digital breast
tomosynthesis was performed. The images were evaluated with
computer-aided detection.

[L CC synth-2D]
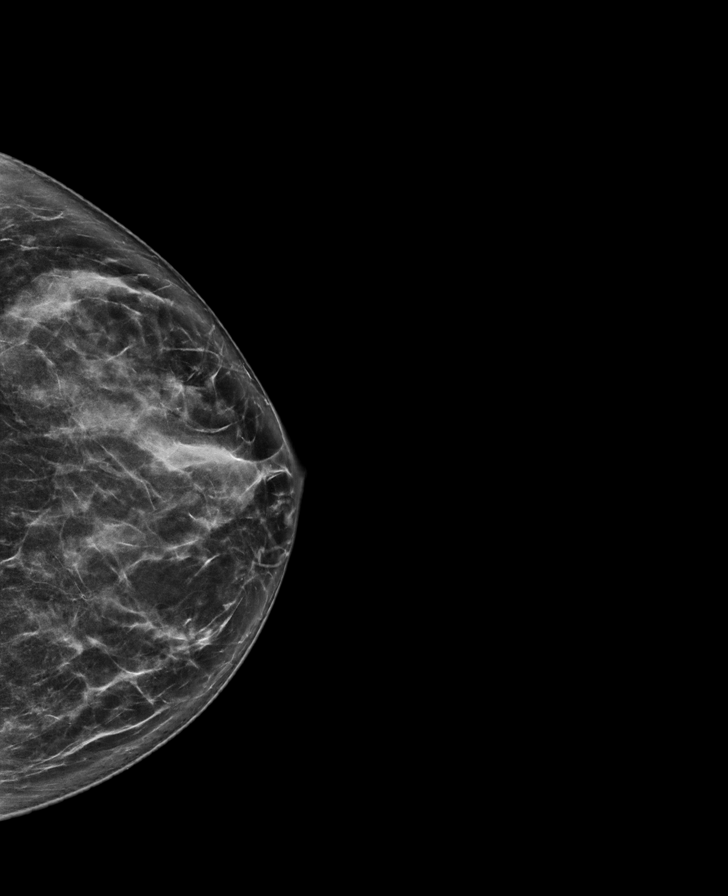

[R CC synth-2D]
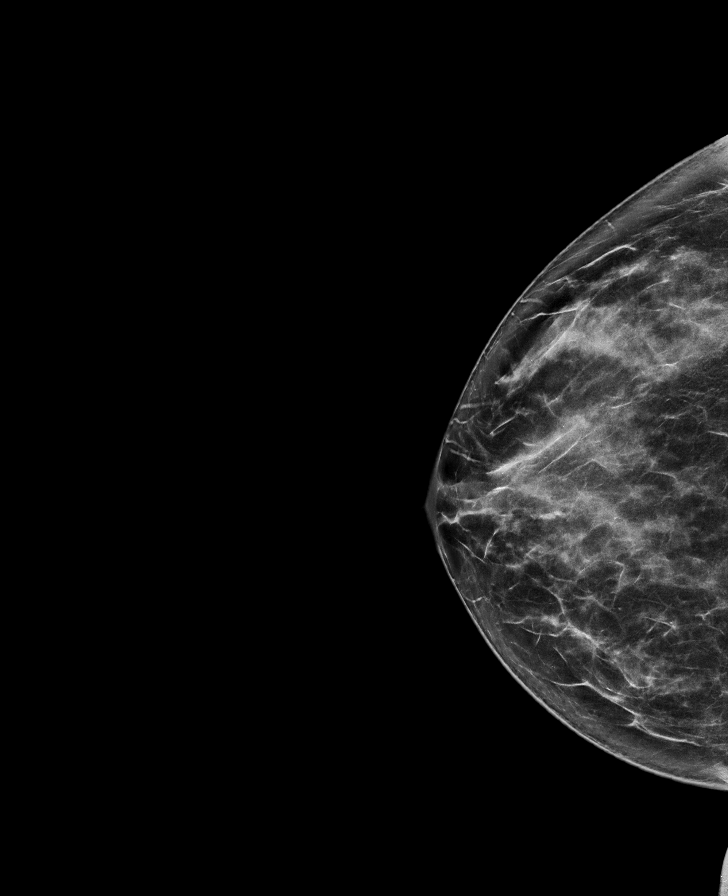

[R MLO synth-2D]
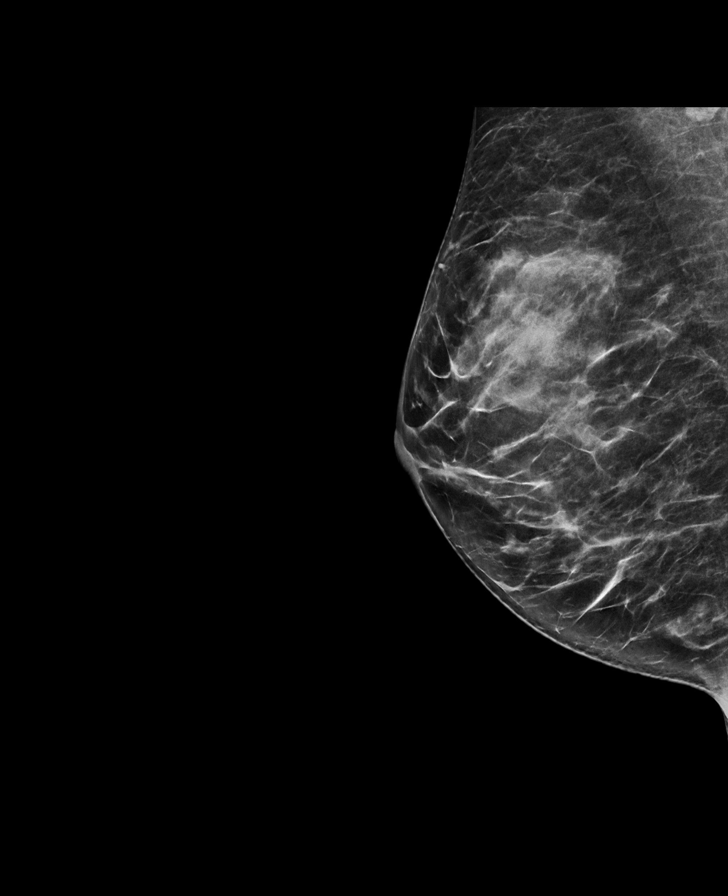

[L MLO synth-2D]
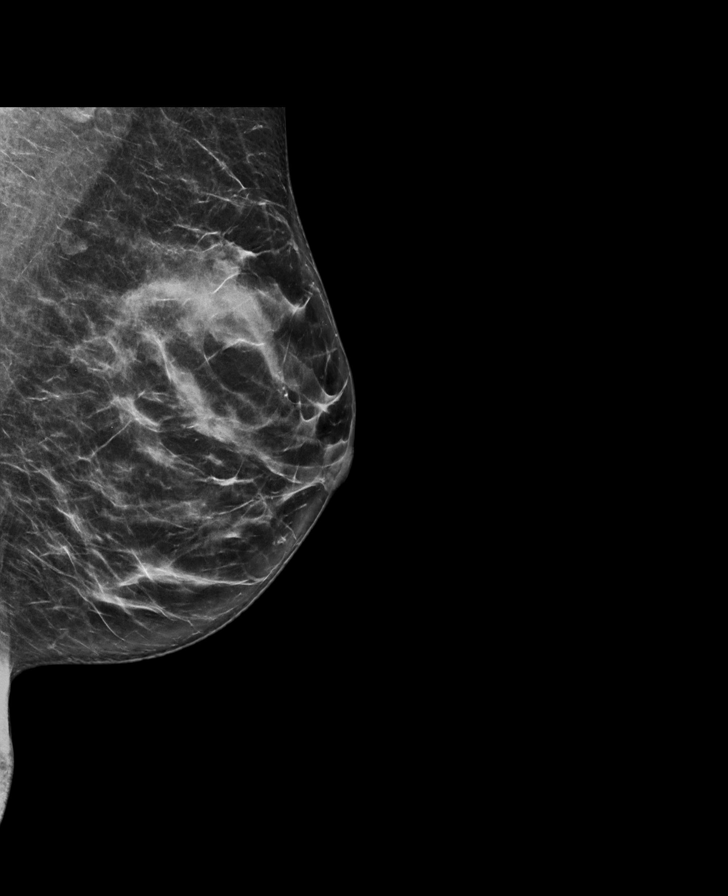

[L MLO tomo · tomo slice 37/74.0]
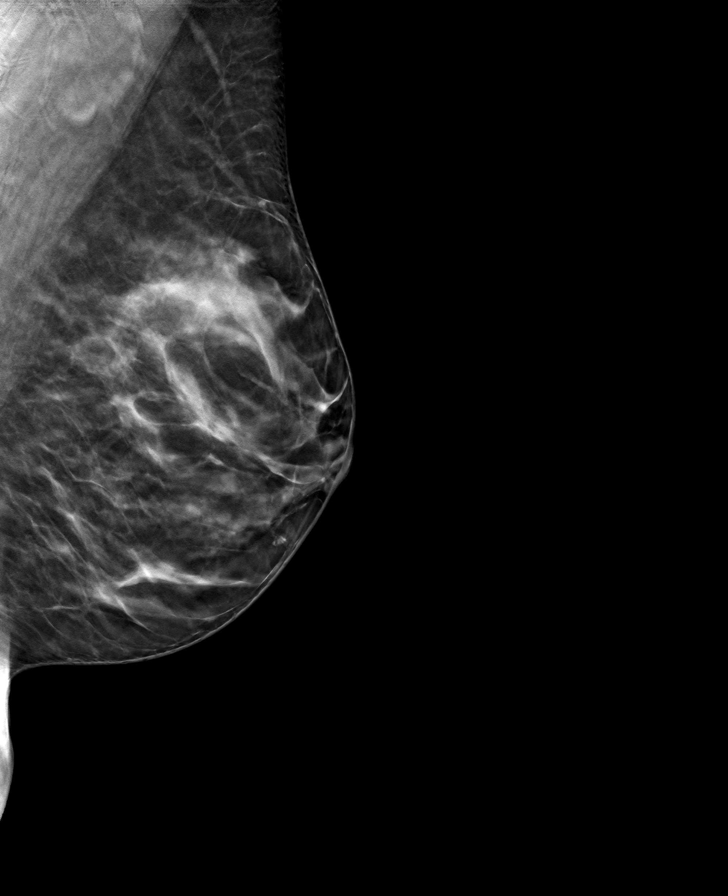

[R MLO tomo · tomo slice 37/72.0]
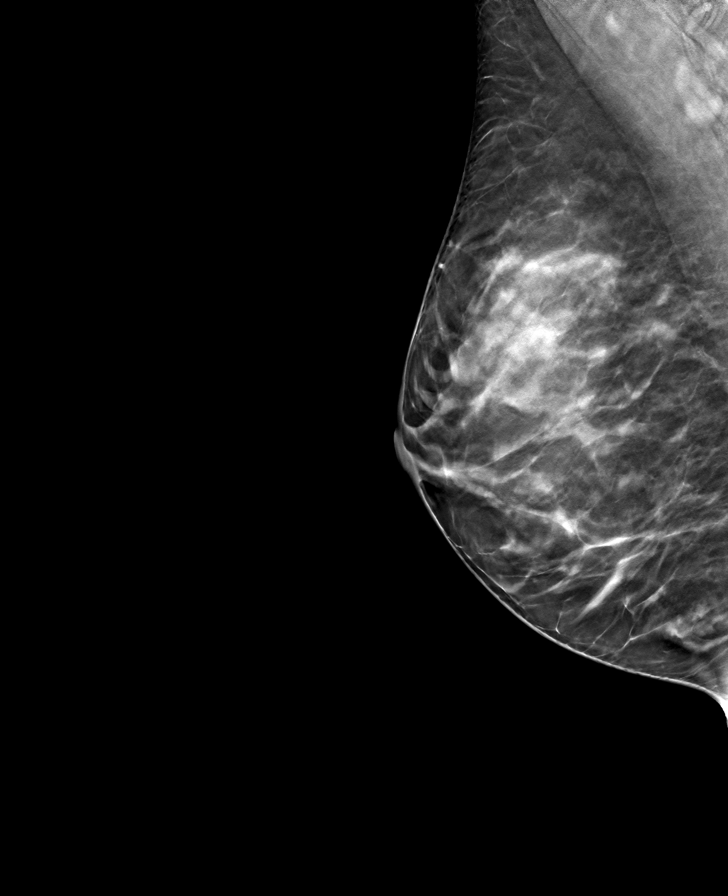

[R CC tomo · tomo slice 38/75.0]
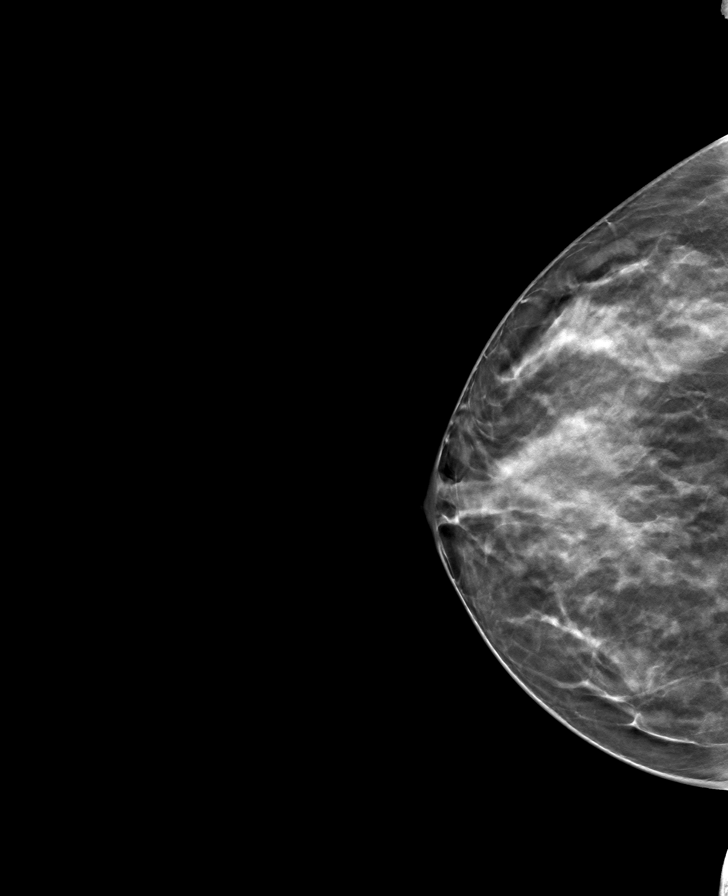

[L CC tomo · tomo slice 37/73.0]
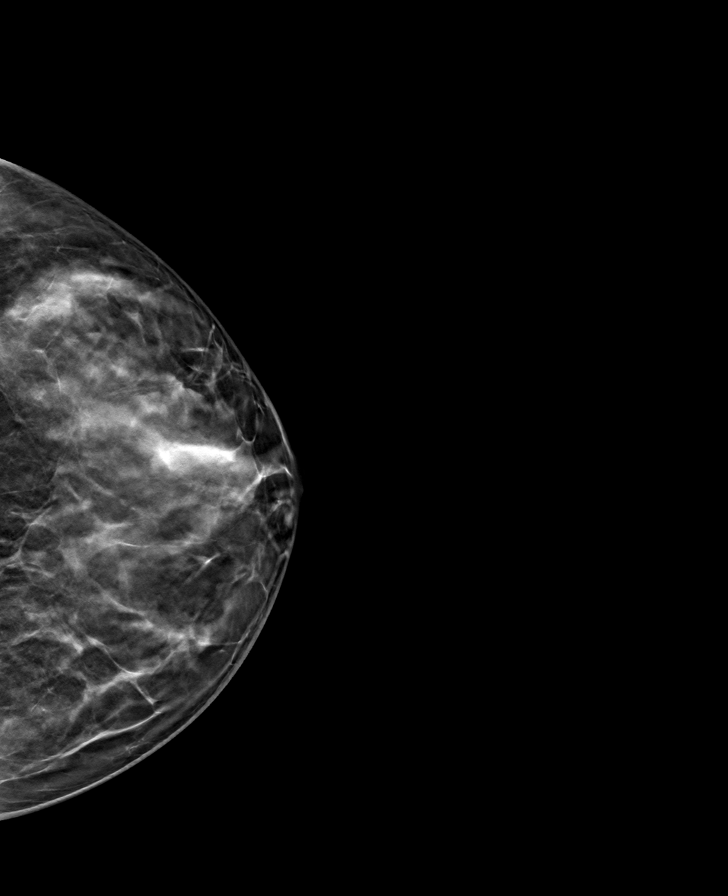

[8 of 24 positions shown; findings below may reference images not displayed]

ACR Breast Density Category c: The breast tissue is heterogeneously
dense, which may obscure small masses.
FINDINGS: There are no findings suspicious for malignancy.
IMPRESSION: No mammographic evidence of malignancy. A result letter of this
screening mammogram will be mailed directly to the patient.

RECOMMENDATION:
Screening mammogram in one year. (Code:Q3-W-BC3)

BI-RADS CATEGORY  1: Negative.

## 2023-06-13 ENCOUNTER — Other Ambulatory Visit: Payer: Self-pay | Admitting: Family Medicine

## 2023-06-13 DIAGNOSIS — Z1231 Encounter for screening mammogram for malignant neoplasm of breast: Secondary | ICD-10-CM

## 2023-06-24 ENCOUNTER — Ambulatory Visit
Admission: RE | Admit: 2023-06-24 | Discharge: 2023-06-24 | Disposition: A | Discharge status: Home / Self Care | Payer: 59 | Source: Ambulatory Visit | Attending: Family Medicine | Admitting: Family Medicine

## 2023-06-24 DIAGNOSIS — Z1231 Encounter for screening mammogram for malignant neoplasm of breast: Secondary | ICD-10-CM
